# Patient Record
Sex: Female | Born: 1960 | Race: White | Hispanic: No | Smoking: Current every day smoker
Health system: Southern US, Community
[De-identification: ages and names within clinical notes are randomized; demographics above are authoritative.]

## PROBLEM LIST (undated history)

## (undated) DIAGNOSIS — M7918 Myalgia, other site: Secondary | ICD-10-CM

## (undated) DIAGNOSIS — N816 Rectocele: Secondary | ICD-10-CM

## (undated) DIAGNOSIS — G8929 Other chronic pain: Secondary | ICD-10-CM

## (undated) DIAGNOSIS — K5909 Other constipation: Secondary | ICD-10-CM

## (undated) DIAGNOSIS — D649 Anemia, unspecified: Secondary | ICD-10-CM

## (undated) DIAGNOSIS — Z5189 Encounter for other specified aftercare: Secondary | ICD-10-CM

## (undated) DIAGNOSIS — R1011 Right upper quadrant pain: Secondary | ICD-10-CM

## (undated) DIAGNOSIS — K589 Irritable bowel syndrome without diarrhea: Secondary | ICD-10-CM

## (undated) DIAGNOSIS — M810 Age-related osteoporosis without current pathological fracture: Secondary | ICD-10-CM

## (undated) DIAGNOSIS — G43909 Migraine, unspecified, not intractable, without status migrainosus: Secondary | ICD-10-CM

## (undated) DIAGNOSIS — G4700 Insomnia, unspecified: Secondary | ICD-10-CM

## (undated) DIAGNOSIS — IMO0002 Reserved for concepts with insufficient information to code with codable children: Secondary | ICD-10-CM

## (undated) HISTORY — DX: Age-related osteoporosis without current pathological fracture: M81.0

## (undated) HISTORY — DX: Reserved for concepts with insufficient information to code with codable children: IMO0002

## (undated) HISTORY — DX: Other chronic pain: G89.29

## (undated) HISTORY — PX: ESOPHAGOGASTRODUODENOSCOPY: SHX1529

## (undated) HISTORY — DX: Rectocele: N81.6

## (undated) HISTORY — PX: OTHER SURGICAL HISTORY: SHX169

## (undated) HISTORY — DX: Irritable bowel syndrome, unspecified: K58.9

## (undated) HISTORY — DX: Migraine, unspecified, not intractable, without status migrainosus: G43.909

## (undated) HISTORY — PX: TOTAL ABDOMINAL HYSTERECTOMY W/ BILATERAL SALPINGOOPHORECTOMY: SHX83

## (undated) HISTORY — DX: Right upper quadrant pain: R10.11

## (undated) HISTORY — DX: Anemia, unspecified: D64.9

## (undated) HISTORY — PX: TONSILLECTOMY: SUR1361

## (undated) HISTORY — DX: Encounter for other specified aftercare: Z51.89

## (undated) HISTORY — PX: COLONOSCOPY: SHX174

## (undated) HISTORY — DX: Insomnia, unspecified: G47.00

## (undated) HISTORY — DX: Other constipation: K59.09

## (undated) HISTORY — DX: Myalgia, other site: M79.18

## (undated) HISTORY — PX: APPENDECTOMY: SHX54

---

## 1998-06-07 ENCOUNTER — Emergency Department (HOSPITAL_COMMUNITY): Admission: EM | Admit: 1998-06-07 | Discharge: 1998-06-07 | Payer: Self-pay | Admitting: Emergency Medicine

## 1999-11-04 ENCOUNTER — Encounter: Payer: Self-pay | Admitting: Gastroenterology

## 1999-11-04 ENCOUNTER — Encounter: Admission: RE | Admit: 1999-11-04 | Discharge: 1999-11-04 | Payer: Self-pay | Admitting: Gastroenterology

## 1999-11-07 ENCOUNTER — Encounter: Payer: Self-pay | Admitting: General Surgery

## 1999-11-07 ENCOUNTER — Encounter: Admission: RE | Admit: 1999-11-07 | Discharge: 1999-11-07 | Payer: Self-pay | Admitting: General Surgery

## 2000-01-23 ENCOUNTER — Observation Stay (HOSPITAL_COMMUNITY): Admission: RE | Admit: 2000-01-23 | Discharge: 2000-01-23 | Payer: Self-pay | Admitting: General Surgery

## 2000-01-23 ENCOUNTER — Encounter (INDEPENDENT_AMBULATORY_CARE_PROVIDER_SITE_OTHER): Payer: Self-pay | Admitting: Specialist

## 2000-02-04 HISTORY — PX: CHOLECYSTECTOMY: SHX55

## 2001-02-12 ENCOUNTER — Ambulatory Visit (HOSPITAL_COMMUNITY): Admission: RE | Admit: 2001-02-12 | Discharge: 2001-02-12 | Payer: Self-pay | Admitting: Family Medicine

## 2001-02-12 ENCOUNTER — Encounter: Payer: Self-pay | Admitting: Family Medicine

## 2002-07-26 ENCOUNTER — Other Ambulatory Visit: Admission: RE | Admit: 2002-07-26 | Discharge: 2002-07-26 | Payer: Self-pay | Admitting: Family Medicine

## 2005-07-15 ENCOUNTER — Emergency Department (HOSPITAL_COMMUNITY): Admission: EM | Admit: 2005-07-15 | Discharge: 2005-07-15 | Payer: Self-pay | Admitting: Family Medicine

## 2006-02-05 ENCOUNTER — Ambulatory Visit: Payer: Self-pay | Admitting: Internal Medicine

## 2006-02-13 ENCOUNTER — Ambulatory Visit: Payer: Self-pay | Admitting: Internal Medicine

## 2006-02-13 LAB — CONVERTED CEMR LAB
Calcium: 8.7 mg/dL (ref 8.4–10.5)
Creatinine, Ser: 0.7 mg/dL (ref 0.4–1.2)
Glomerular Filtration Rate, Af Am: 117 mL/min/{1.73_m2}
Potassium: 3.7 meq/L (ref 3.5–5.1)
TSH: 2.01 microintl units/mL (ref 0.35–5.50)

## 2006-03-30 ENCOUNTER — Ambulatory Visit: Payer: Self-pay | Admitting: Internal Medicine

## 2006-04-02 ENCOUNTER — Encounter: Admission: RE | Admit: 2006-04-02 | Discharge: 2006-04-02 | Payer: Self-pay | Admitting: Internal Medicine

## 2006-06-04 ENCOUNTER — Ambulatory Visit: Payer: Self-pay | Admitting: Internal Medicine

## 2006-06-09 ENCOUNTER — Encounter: Payer: Self-pay | Admitting: Internal Medicine

## 2006-06-09 ENCOUNTER — Ambulatory Visit (HOSPITAL_COMMUNITY): Admission: RE | Admit: 2006-06-09 | Discharge: 2006-06-09 | Payer: Self-pay | Admitting: Internal Medicine

## 2007-05-05 ENCOUNTER — Ambulatory Visit: Payer: Self-pay | Admitting: Internal Medicine

## 2007-05-05 LAB — CONVERTED CEMR LAB
Alkaline Phosphatase: 49 units/L (ref 39–117)
Amylase: 40 units/L (ref 27–131)
Basophils Absolute: 0.1 10*3/uL (ref 0.0–0.1)
Bilirubin, Direct: 0.1 mg/dL (ref 0.0–0.3)
Eosinophils Absolute: 0.1 10*3/uL (ref 0.0–0.7)
MCHC: 32.5 g/dL (ref 30.0–36.0)
Neutro Abs: 3.7 10*3/uL (ref 1.4–7.7)
Neutrophils Relative %: 50.8 % (ref 43.0–77.0)
Platelets: 210 10*3/uL (ref 150–400)
RDW: 12.5 % (ref 11.5–14.6)

## 2007-07-12 ENCOUNTER — Telehealth: Payer: Self-pay | Admitting: Internal Medicine

## 2007-07-20 ENCOUNTER — Emergency Department (HOSPITAL_COMMUNITY): Admission: EM | Admit: 2007-07-20 | Discharge: 2007-07-20 | Payer: Self-pay | Admitting: Family Medicine

## 2007-10-01 ENCOUNTER — Telehealth: Payer: Self-pay | Admitting: Internal Medicine

## 2007-10-06 ENCOUNTER — Encounter: Payer: Self-pay | Admitting: Internal Medicine

## 2007-10-07 ENCOUNTER — Encounter: Admission: RE | Admit: 2007-10-07 | Discharge: 2007-10-07 | Payer: Self-pay | Admitting: Internal Medicine

## 2007-10-07 ENCOUNTER — Encounter: Payer: Self-pay | Admitting: Internal Medicine

## 2008-03-23 ENCOUNTER — Ambulatory Visit: Payer: Self-pay | Admitting: Internal Medicine

## 2008-03-23 DIAGNOSIS — K5900 Constipation, unspecified: Secondary | ICD-10-CM | POA: Insufficient documentation

## 2008-03-23 DIAGNOSIS — K589 Irritable bowel syndrome without diarrhea: Secondary | ICD-10-CM | POA: Insufficient documentation

## 2008-03-23 DIAGNOSIS — R1011 Right upper quadrant pain: Secondary | ICD-10-CM

## 2008-03-23 LAB — CONVERTED CEMR LAB: IgA: 136 mg/dL (ref 68–378)

## 2009-01-19 ENCOUNTER — Ambulatory Visit: Payer: Self-pay | Admitting: Internal Medicine

## 2009-01-19 DIAGNOSIS — R1012 Left upper quadrant pain: Secondary | ICD-10-CM

## 2009-07-31 ENCOUNTER — Telehealth: Payer: Self-pay | Admitting: Internal Medicine

## 2009-07-31 ENCOUNTER — Ambulatory Visit: Payer: Self-pay | Admitting: Gastroenterology

## 2009-07-31 DIAGNOSIS — R110 Nausea: Secondary | ICD-10-CM

## 2009-08-01 ENCOUNTER — Ambulatory Visit (HOSPITAL_COMMUNITY): Admission: RE | Admit: 2009-08-01 | Discharge: 2009-08-01 | Payer: Self-pay | Admitting: Gastroenterology

## 2009-08-01 LAB — CONVERTED CEMR LAB
ALT: 15 units/L (ref 0–35)
Alkaline Phosphatase: 52 units/L (ref 39–117)
Basophils Relative: 0.5 % (ref 0.0–3.0)
Eosinophils Relative: 1.6 % (ref 0.0–5.0)
Monocytes Relative: 4.8 % (ref 3.0–12.0)
Neutrophils Relative %: 48.6 % (ref 43.0–77.0)
Platelets: 172 10*3/uL (ref 150.0–400.0)
RBC: 4.25 M/uL (ref 3.87–5.11)
RDW: 13.9 % (ref 11.5–14.6)
Total Protein: 6.9 g/dL (ref 6.0–8.3)

## 2009-08-20 ENCOUNTER — Ambulatory Visit: Payer: Self-pay | Admitting: Internal Medicine

## 2009-08-20 ENCOUNTER — Encounter (INDEPENDENT_AMBULATORY_CARE_PROVIDER_SITE_OTHER): Payer: Self-pay | Admitting: *Deleted

## 2009-08-20 DIAGNOSIS — R1319 Other dysphagia: Secondary | ICD-10-CM

## 2009-08-20 DIAGNOSIS — R109 Unspecified abdominal pain: Secondary | ICD-10-CM

## 2009-09-17 ENCOUNTER — Encounter (INDEPENDENT_AMBULATORY_CARE_PROVIDER_SITE_OTHER): Payer: Self-pay | Admitting: *Deleted

## 2009-12-05 ENCOUNTER — Telehealth: Payer: Self-pay | Admitting: Internal Medicine

## 2009-12-06 ENCOUNTER — Telehealth: Payer: Self-pay | Admitting: Internal Medicine

## 2009-12-06 ENCOUNTER — Encounter: Payer: Self-pay | Admitting: Internal Medicine

## 2009-12-10 ENCOUNTER — Ambulatory Visit: Payer: Self-pay | Admitting: Internal Medicine

## 2009-12-10 ENCOUNTER — Telehealth: Payer: Self-pay | Admitting: Internal Medicine

## 2009-12-10 ENCOUNTER — Encounter: Payer: Self-pay | Admitting: Internal Medicine

## 2009-12-10 DIAGNOSIS — R112 Nausea with vomiting, unspecified: Secondary | ICD-10-CM

## 2010-01-10 ENCOUNTER — Ambulatory Visit: Payer: Self-pay | Admitting: Internal Medicine

## 2010-01-22 ENCOUNTER — Telehealth: Payer: Self-pay | Admitting: Internal Medicine

## 2010-03-07 NOTE — Progress Notes (Signed)
Summary: Nausea & vomitting   Phone Note Call from Patient   Call For: Dr Leone Payor Summary of Call: Having nausea, vomitting & abd pain since 6am today. Initial call taken by: Leanor Kail [facility],  December 10, 2009 8:27 AM  Follow-up for Phone Call        Patient wants to be seen today by Dr Leone Payor or the extender for nausea, vomiting, and abdominal pain.  She is scheduled to see Willette Cluster RNP today at 2:00 Follow-up by: Darcey Nora RN, CGRN,  December 10, 2009 9:02 AM

## 2010-03-07 NOTE — Progress Notes (Signed)
Summary: Triage   Phone Note Call from Patient Call back at Work Phone [phone]   Caller: Patient Call For: Dr. Leone Payor Reason for Call: Talk to Nurse Summary of Call: "2nd attack of the day"...pain in upper right side Initial call taken by: Karna Christmas,  December 05, 2009 12:54 PM  Follow-up for Phone Call        Patient  had an "attack of pain on my right upper side" this am at 5:30 she states this pain lasted about 1 1/2 hours then stopped.  Just before she called pain started again.  She took some levsin and this has relieved her pain.  She reports she is better right now.  She is advised to continue the Levsin as needed and call us back if symptoms not relieved with Levsin.  She states " I am feeling much better".  She wanted to know how may Levsin she can take when she has an attack .  She is advsied she may take 2 every 4-6 hours as needed.  She will call back for further problems. Follow-up by: Darcey Nora RN, CGRN,  December 05, 2009 1:48 PM  Additional Follow-up for Phone Call Additional follow up Details #1::        Correct advice was given Additional Follow-up by: Iva Boop MD, [facility],  December 05, 2009 2:10 PM

## 2010-03-07 NOTE — Progress Notes (Signed)
Summary: Jury Duty   Phone Note Call from Patient Call back at Work Phone [phone]   Caller: Patient Call For: Dr. Leone Payor Summary of Call: Sch'd for Jury Duty for 12-12-11and requesting to be excused for IBS.  Initial call taken by: Karna Christmas,  December 06, 2009 8:06 AM  Follow-up for Phone Call        Dr Leone Payor is this appropriate? Darcey Nora RN, [facility]  December 06, 2009 8:09 AM  Additional Follow-up for Phone Call Additional follow up Details #1::        I will do it - reasonable if she were to get an attack there would be problem. Additional Follow-up by: Iva Boop MD, [facility],  December 06, 2009 8:31 AM     Appended Document: Payton Mccallum Duty patient aware to come pick up the Jury duty note

## 2010-03-07 NOTE — Assessment & Plan Note (Signed)
Summary: RUQ abdominal pain/sheri   History of Present Illness Visit Type: Follow-up Visit Primary GI MD: Stan Head MD [facility] Primary Provider: Elfredia Nevins, MD Requesting Provider: n/a Chief Complaint: RUQ abdominal pain that is constant.  Woke pt up at 4am today.  Radiated to chest and back.  Pt is still having a nagging pain. History of Present Illness:   PLEASANT 50 YO FEMALE KNOWN TO DR. Leone Payor. SHE IS S/P CHOLECYSTECTOMY 2002, HAS HX OF IBS. SHE HAS BEEN SEEN IN THE PAST AFTER EPISODIC RUQ PAIN, ASSOCIATED WITH N/V-LAST SEEN 2/10. SHE COMES IN TODAY WITH ACUTE SEVERE EPISODE OF RUQ PAIN RADIATING TO HER BACK WHICH WOKE HER UP FROM SLEEP AT 4 AM TODAY. PAIN WAS INTENSE SQUEEZING, DOUBLED HER OVER AND LASTED ABOUT 45 MINUTES THEN EASED. SHE WAS NAUSEATED BUT DID NOT VOMIT, NO DIAPHORESIS, SOB ETC. SHE CALLED HER FATHER  AS SHE THOUGHT SHE WOULD NEED TO GO TO THE ER-BUT FINALLY DECREASED. SHE HAS PERSITENT PAIN NOW, ALL DAY 2-3/10, NO APPETITE, ONE EPISODE OF DIARRHEA THIS AM. IN BACKGROUND OF THIS SAYS HAS HAD SXS THIS PAST YEAR WITH ALMOST CONSTANT LOW GRADE ACHNG IN HER RUQ AND IN TO RIGHT BACK. THE INTENSE PAIN REMINDS HER OF HER GB SXS.    GI Review of Systems    Reports abdominal pain, bloating, loss of appetite, and  nausea.     Location of  Abdominal pain: RUQ.    Denies acid reflux, belching, chest pain, dysphagia with liquids, dysphagia with solids, heartburn, vomiting, vomiting blood, and  weight loss.      Reports diarrhea.     Denies anal fissure, black tarry stools, change in bowel habit, constipation, diverticulosis, fecal incontinence, hemorrhoids, irritable bowel syndrome, jaundice, light color stool, liver problems, rectal bleeding, and  rectal pain.   Current Medications (verified): 1)  Tylenol With Codeine #3 300-30 Mg Tabs (Acetaminophen-Codeine) .... 1/2 Tablet By Mouth Once Daily As Needed For Pain  Allergies (verified): 1)  ! Penicillin  Past  History:  Past Medical History: Reviewed history from 05/05/2007 and no changes required. IBS - mixed Insomnia Rectocele/Cystocele  Past Surgical History: Appendectomy Cholecystectomy 2002 TAH and BSO Adhesiolysis Tonsillectomy  Family History: Reviewed history from 03/23/2008 and no changes required. Patient adopted   Social History: Married, 1 girl Occupation:[employer]  Patient currently smokes. -1ppd Alcohol Use - no Daily Caffeine Use-5 Illicit Drug Use - no Patient does not get regular exercise.  Patient has been counseled to quit. - Refuses  Review of Systems       The patient complains of back pain.  The patient denies allergy/sinus, anemia, anxiety-new, arthritis/joint pain, blood in urine, breast changes/lumps, change in vision, confusion, cough, coughing up blood, depression-new, fainting, fatigue, fever, headaches-new, hearing problems, heart murmur, heart rhythm changes, itching, menstrual pain, muscle pains/cramps, night sweats, nosebleeds, pregnancy symptoms, shortness of breath, skin rash, sleeping problems, sore throat, swelling of feet/legs, swollen lymph glands, thirst - excessive , urination - excessive , urination changes/pain, urine leakage, vision changes, and voice change.         SEE HPI  Vital Signs:  Patient profile:   50 year old female Height:      64 inches Weight:      135 pounds BMI:     23.26 BSA:     1.66 Pulse rate:   64 / minute Pulse rhythm:   regular BP sitting:   120 / 82  (left arm) Cuff size:   regular  Vitals  Entered By: Francee Piccolo CMA Duncan Dull) (July 31, 2009 2:39 PM)  Physical Exam  General:  Well developed, well nourished, no acute distress. Head:  Normocephalic and atraumatic. Eyes:  PERRLA, no icterus. Lungs:  Clear throughout to auscultation. Heart:  Regular rate and rhythm; no murmurs, rubs,  or bruits. Abdomen:  SOFT, TENDER RUQ,EPIGASTRIUM, NO GUARDING OR REBOUND, NO MASS OR HSM,BS+ Rectal:  NOT  DONE Extremities:  No clubbing, cyanosis, edema or deformities noted. Neurologic:  Alert and  oriented x4;  grossly normal neurologically. Psych:  Alert and cooperative. Normal mood and affect.  Impression & Recommendations:  Problem # 1:  RUQ PAIN (ICD-789.01) Assessment Deteriorated 50 YO FEMALE WITH ONE YEAR HX OF CHRONIC INTERMITTENT MILD RUQ PAIN, NOW WITH ACUTE SEVERE RUQ PAIN ONSET TODAY. R/O CBD STONE, R/O SPHINCTER OF ODDI DYSFUNCTION. LABS AS BELOW SCHEDULE FOR UPPER ABDOMINAL US WITHIN NEXT 24 HOURS SOFT, BLAND DIET EMPIRIC ACIPHEX 20 MG DAILY IN AM-SAMPLES GIVEN PT HAS TYLENOL #3 AT HOME FOR as needed USE. FURTHER WORKUP WITH DR. Leone Payor PENDING ABOVE. Orders: TLB-CBC Platelet - w/Differential (85025-CBCD) TLB-Hepatic/Liver Function Pnl (80076-HEPATIC) TLB-Lipase (83690-LIPASE) Ultrasound Abdomen (UAS)  Patient Instructions: 1)  You have been scheduled for an abdominal ultrasound 08/01/09 @ 8 am. Please arrive to [facility] at 7:45 am for registration. 2)  Please go to the lab before leaving the office today to have your CBC, Hepatic panel and Lipase drawn. 3)  Take Aciphex 1 tablet by mouth every morning. Samples have been provided. 4)  Follow a soft, bland diet. 5)  Take your Tylenol #3 as needed. 6)  Copy sent to : Dr Elfredia Nevins 7)  The medication list was reviewed and reconciled.  All changed / newly prescribed medications were explained.  A complete medication list was provided to the patient / caregiver.

## 2010-03-07 NOTE — Letter (Signed)
Summary: EGD Instructions  [facility]  [address] [place], [place] [address]   Phone: [phone]  Fax: [phone]       ALIESHA DOLATA    1960/10/29    MRN: [id number]       Procedure Day /Date:01-10-10     Arrival Time:1:00 PM      Procedure Time: 2:00 PM     Location of Procedure:                    X    [facility] (4th Floor)  PREPARATION FOR ENDOSCOPY   On 01-10-10  THE DAY OF THE PROCEDURE:  1.   No solid foods, milk or milk products are allowed after midnight the night before your procedure.  2.   Do not drink anything colored red or purple.  Avoid juices with pulp.  No orange juice.  3.  You may drink clear liquids until Noon , which is 2 hours before your procedure.                                                                                                CLEAR LIQUIDS INCLUDE: Water Jello Ice Popsicles Tea (sugar ok, no milk/cream) Powdered fruit flavored drinks Coffee (sugar ok, no milk/cream) Gatorade Juice: apple, white grape, white cranberry  Lemonade Clear bullion, consomm, broth Carbonated beverages (any kind) Strained chicken noodle soup Hard Candy   MEDICATION INSTRUCTIONS  Unless otherwise instructed, you should take regular prescription medications with a small sip of water as early as possible the morning of your procedure.           OTHER INSTRUCTIONS  You will need a responsible adult at least 48 years of age to accompany you and drive you home.   This person must remain in the waiting room during your procedure.  Wear loose fitting clothing that is easily removed.  Leave jewelry and other valuables at home.  However, you may wish to bring a book to read or an iPod/MP3 player to listen to music as you wait for your procedure to start.  Remove all body piercing jewelry and leave at home.  Total time from sign-in until discharge is approximately 2-3 hours.  You should go home directly after your  procedure and rest.  You can resume normal activities the day after your procedure.  The day of your procedure you should not:   Drive   Make legal decisions   Operate machinery   Drink alcohol   Return to work  You will receive specific instructions about eating, activities and medications before you leave.    The above instructions have been reviewed and explained to me by   _______________________    I fully understand and can verbalize these instructions _____________________________ Date _________

## 2010-03-07 NOTE — Letter (Signed)
Summary: [name] and No Reschedule  [facility]  [address] [place], [place] [address]   Phone: [phone]  Fax: [phone]      September 17, 2009 MRN: [id number]   [name] [address] [place], [place]  [address]     You recently cancelled your endoscopic procedure at the [facility] and did not reschedule for another date.    Your Harbert Fitterer recommended this procedure for the benefit of your health.  It is very important that you reschedule it.  Failure to do so may be to the detriment of your health.  Please call us at [phone] and we will be happy to assist you with rescheduling.    If you were referred for this procedure by another physician/[name], we will notify him/her that you did not keep your appointment.   Sincerely,  [facility]

## 2010-03-07 NOTE — Procedures (Signed)
Summary: Upper Endoscopy  Patient: [name] Note: All result statuses are Final unless otherwise noted.  Tests: (1) Upper Endoscopy (EGD)   EGD Upper Endoscopy       DONE     [facility]     [address]     [place], [place]  [address]           ENDOSCOPY PROCEDURE REPORT           PATIENT:  [name], [name]  MR#:  [id number]     BIRTHDATE:  April 04, 1960, 49 yrs. old  GENDER:  female           ENDOSCOPIST:  Iva Boop, MD, [facility]           PROCEDURE DATE:  01/10/2010     PROCEDURE:  EGD, diagnostic 08657     ASA CLASS:  Class I     INDICATIONS:  abdominal pain, right upper quad           MEDICATIONS:   Fentanyl 50 mcg IV, Versed 5 mg     TOPICAL ANESTHETIC:  Exactacain Spray           DESCRIPTION OF PROCEDURE:   After the risks benefits and     alternatives of the procedure were thoroughly explained, informed     consent was obtained.  The LB [id number] endoscope was     introduced through the mouth and advanced to the second portion of     the duodenum, without limitations.  The instrument was slowly     withdrawn as the mucosa was fully examined.     <<PROCEDUREIMAGES>>           The upper, middle, and distal third of the esophagus were     carefully inspected and no abnormalities were noted. The z-line     was well seen at the GEJ. The endoscope was pushed into the fundus     which was normal including a retroflexed view. The antrum,gastric     body, first and second part of the duodenum were unremarkable.     Z-line at 39 cm.    Retroflexed views revealed no abnormalities.     The scope was then withdrawn from the patient and the procedure     completed.           COMPLICATIONS:  None           ENDOSCOPIC IMPRESSION:     1) Normal EGD     RECOMMENDATIONS:     1) start hyoscyamine 0.375 milligrams twice a day for abdominal     pain     2) may still take the hyoscyamine (levsin) 0.125 mg under the     tongue as needed if have attacks of RUQ pain    3) Follow-up with Dr. Leone Payor if this is not successful     treatment for pain and Irritable Bowel Syndrome           REPEAT EXAM:  In for as needed.           Iva Boop, MD, [facility]           CC:  Elfredia Nevins, MD     The Patient           n.     eSIGNED:   Iva Boop at 01/10/2010 02:38 PM           [place], [place], [id number]  Note: An exclamation mark (!) indicates  a result that was not dispersed into the flowsheet. Document Creation Date: 01/10/2010 2:38 PM _______________________________________________________________________  (1) Order result status: Final Collection or observation date-time: 01/10/2010 14:27 Requested date-time:  Receipt date-time:  Reported date-time:  Referring Physician:   Ordering Physician: Stan Head [id number]) Specimen Source:  Source: [name] Order Number: [id number] Lab site:

## 2010-03-07 NOTE — Progress Notes (Signed)
Summary: Triage  Medications Added DICYCLOMINE HCL 20 MG TABS (DICYCLOMINE HCL) 1 by mouth ac and hs       Phone Note Call from Patient Call back at Work Phone [phone]   Caller: Patient Call For: Dr. Leone Payor Reason for Call: Talk to Nurse Summary of Call: Last time she was in she was told she could be referred to someone at [facility]. Would like appt. sch'd Initial call taken by: Karna Christmas,  January 22, 2010 12:07 PM  Follow-up for Phone Call        Patient  was referred [facility] in 03/2008 for constipation.  Dr Leone Payor [facility]  Still ok to schedule?  I have left a message for the patient to call back to discuss symptoms. Follow-up by: Darcey Nora RN, CGRN,  January 22, 2010 2:11 PM  Additional Follow-up for Phone Call Additional follow up Details #1::        I spoke with the patient and she is not having constipation, she is having abdominal pain.  Dr Leone Payor she says her pain is daily and nothing is helping.  She is wondering if there is any help at [facility].  Please advise.  She is aware that Dr Leone Payor is out of the office until Monday Additional Follow-up by: Darcey Nora RN, CGRN,  January 22, 2010 4:01 PM    Additional Follow-up for Phone Call Additional follow up Details #2::    1) I now recommend referral to [facility]BS [facility] 2) she can try dicyclomine 20 mg qAC and HS #120 with 1 refill instead of hyoscyamine for abdominal pain  Follow-up by: Iva Boop MD, [facility],  January 26, 2010 7:19 AM  Additional Follow-up for Phone Call Additional follow up Details #3:: Details for Additional Follow-up Action Taken: [facility] for patient to return call per Dr Marvell Fuller recommendations.Graciella Freer, RN 01/29/10 @ 1105am   New rx sent to patient's pharmacy.  Records sent to [facility] for referral. Additional Follow-up by: Darcey Nora RN, CGRN,  January 30, 2010 11:41 AM  New/Updated Medications: DICYCLOMINE HCL 20 MG  TABS (DICYCLOMINE HCL) 1 by mouth ac and hs Prescriptions: DICYCLOMINE HCL 20 MG TABS (DICYCLOMINE HCL) 1 by mouth ac and hs  #120 x 1   Entered by:   Darcey Nora RN, CGRN   Authorized by:   Iva Boop MD, [facility]   Signed by:   Darcey Nora RN, CGRN on 01/30/2010   Method used:   Electronically to        [facility]  [address] [phone]* (retail)       [address]       [place], [place]  [address]       Ph: [phone]       Fax: [phone]   RxID:   [id number]

## 2010-03-07 NOTE — Miscellaneous (Signed)
Summary: hyoscyamine 0.375 mg rx  Clinical Lists Changes  Medications: Added new medication of HYOSCYAMINE SULFATE CR 0.375 MG  TB12 (HYOSCYAMINE SULFATE) 1 by mouth two times a day regularly to prevent abdominal pain - Signed Changed medication from LEVSIN/SL 0.125 MG SUBL (HYOSCYAMINE SULFATE) Dissolve 1 tablet under the tongue every 4-6 hours as needed from abdominal discomfort to HYOSCYAMINE SULFATE 0.125 MG SUBL (HYOSCYAMINE SULFATE) Rx of HYOSCYAMINE SULFATE CR 0.375 MG  TB12 (HYOSCYAMINE SULFATE) 1 by mouth two times a day regularly to prevent abdominal pain;  #60 x 5;  Signed;  Entered by: Iva Boop MD, [facility];  Authorized by: Iva Boop MD, [facility];  Method used: [facility] [address] [phone]*, [address] [place], [place], [place]  [address], Ph: [phone], Fax: [phone]    Prescriptions: HYOSCYAMINE SULFATE CR 0.375 MG  TB12 (HYOSCYAMINE SULFATE) 1 by mouth two times a day regularly to prevent abdominal pain  #60 x 5   Entered and Authorized by:   Iva Boop MD, [facility]   Signed by:   Iva Boop MD, FACG on 01/10/2010   Method used:   [facility]        [facility]  [address] [id number])       [address]       [place], [place]  [address]       Ph: [phone]       Fax: [phone]   RxID:   [id number]

## 2010-03-07 NOTE — Letter (Signed)
Summary: Generic Letter: Out of [facility]  [address] [place], [place] [address]   Phone: [phone]  Fax: [phone]    12/06/2009  To Whom it May Concern:  Re: [name] [address] [place], [place]  [address]  Ladies and Gentlemen:  [name] is under my care for a chronic medical condition with episodic and unpredictable flares of symptoms that make her unable to work or concentrate in a manner that would be necessary to serve as a juror, in my opinion.  I recommend that she be excused from jury duty because of this medical condition.   Yours Truly,   Stan Head MD, [facility]

## 2010-03-07 NOTE — Progress Notes (Signed)
Summary: Abd Pain   Phone Note Call from Patient Call back at Home Phone [phone]   Call For: Dr Leone Payor Reason for Call: Talk to Nurse Summary of Call: Intense abd Pain pain on by her pancreas. Want to be worked in sooner than 08-20-09. Initial call taken by: Leanor Kail [facility],  July 31, 2009 8:13 AM  Follow-up for Phone Call        Left message for patient to call back Darcey Nora RN, [facility]  July 31, 2009 8:47 AM Follow-up by: Darcey Nora RN, CGRN,  July 31, 2009 8:47 AM  Additional Follow-up for Phone Call Additional follow up Details #1::        Patient  had an "attack" of RUQ abdominal pain that started at 4 am and lasted about 45 minutes.  She described the pain as "severe".  She has a lasting "dull ache".  Patient  wants to be seen earlier than 07/21/09, she will come in today at 2:30 to see Mike Gip PA  Additional Follow-up by: Darcey Nora RN, CGRN,  July 31, 2009 8:57 AM

## 2010-03-07 NOTE — Assessment & Plan Note (Signed)
Summary: nausea, vomiting, and abdominal pain/sheri    History of Present Illness Visit Type: Follow-up Visit Primary GI MD: Stan Head MD [facility] Primary Provider: Elfredia Nevins, MD Requesting Provider: n/a Chief Complaint: nausea vomiting and abd pain since 6 am this morning History of Present Illness:   Patient followed by Dr. Leone Payor for upper abdominal pain and IBS. She is here with two separate problems. First, she has intermittent episodes of RUQ pain,  Attacks occur once a month on average and always occur between 2-5am when lying on right side.  Seen in July for upper abdominal pain. Given trial of Levsin and scheduled for EGD but patient cancelled secondary to finances. She recently took Levsin for the first time and it did help the RUQ pain.  Second problem started several weeks ago. Patient has epigastric pain associated with nausea, heaving, and constant belching of air. Describes pain as pressure, unrelated to meals.  This am she woke up nauseated with dry heaves. Went back to sleep, woke up  with violent vomiting. Emesis contained food eaten 24 hours prior.  No GERD symptoms.    GI Review of Systems    Reports abdominal pain, nausea, and  vomiting.     Location of  Abdominal pain: generalized.    Denies acid reflux, belching, bloating, chest pain, dysphagia with liquids, dysphagia with solids, heartburn, loss of appetite, vomiting blood, weight loss, and  weight gain.        Denies anal fissure, black tarry stools, change in bowel habit, constipation, diarrhea, diverticulosis, fecal incontinence, heme positive stool, hemorrhoids, irritable bowel syndrome, jaundice, light color stool, liver problems, rectal bleeding, and  rectal pain.    Current Medications (verified): 1)  Tylenol With Codeine #3 300-30 Mg Tabs (Acetaminophen-Codeine) .... 1/2 Tablet By Mouth Once Daily As Needed For Pain 2)  Levsin/sl 0.125 Mg Subl (Hyoscyamine Sulfate) .... Dissolve 1 Tablet Under The  Tongue Every 4-6 Hours As Needed From Abdominal Discomfort  Allergies (verified): 1)  ! Penicillin  Past History:  Past Medical History: Reviewed history from 05/05/2007 and no changes required. IBS - mixed Insomnia Rectocele/Cystocele  Past Surgical History: Reviewed history from 07/31/2009 and no changes required. Appendectomy Cholecystectomy 2002 TAH and BSO Adhesiolysis Tonsillectomy  Family History: Reviewed history from 03/23/2008 and no changes required. Patient adopted   Social History: Reviewed history from 07/31/2009 and no changes required. Married, 1 girl Occupation:[employer]ing  Patient currently smokes. -1ppd Alcohol Use - no Daily Caffeine Use-5 Illicit Drug Use - no Patient does not get regular exercise.  Patient has been counseled to quit. - Refuses  Review of Systems  The patient denies allergy/sinus, anemia, anxiety-new, arthritis/joint pain, back pain, blood in urine, breast changes/lumps, change in vision, confusion, cough, coughing up blood, depression-new, fainting, fatigue, fever, headaches-new, hearing problems, heart murmur, heart rhythm changes, itching, menstrual pain, muscle pains/cramps, night sweats, nosebleeds, pregnancy symptoms, shortness of breath, skin rash, sleeping problems, sore throat, swelling of feet/legs, swollen lymph glands, thirst - excessive , urination - excessive , urination changes/pain, urine leakage, vision changes, and voice change.    Vital Signs:  Patient profile:   50 year old female Height:      64 inches Weight:      139 pounds BMI:     23.95 Pulse rate:   78 / minute Pulse rhythm:   regular BP sitting:   120 / 70  (left arm)  Vitals Entered By: Chales Abrahams CMA Duncan Dull) (December 10, 2009 2:07 PM)  Physical Exam  General:  Well developed, well nourished, no acute distress. Head:  Normocephalic and atraumatic. Eyes:  Conjunctiva pink, no icterus.  Neck:  no obvious masses  Lungs:  Clear throughout  to auscultation. Heart:  Regular rate and rhythm; no murmurs, rubs,  or bruits. Abdomen:  Abdomen soft, nontender, nondistended. No obvious masses or hepatomegaly.Normal bowel sounds.  Msk:  Symmetrical with no gross deformities. Normal posture. Extremities:  No palmar erythema, no edema.  Neurologic:  Alert and  oriented x4;  grossly normal neurologically. Skin:  Intact without significant lesions or rashes. Cervical Nodes:  No significant cervical adenopathy. Psych:  Alert and cooperative. Normal mood and affect.   Impression & Recommendations:  Problem # 1:  ABDOMINAL PAIN, UPPER (ICD-789.09) Assessment Deteriorated Has been followed by Dr. Leone Payor for RUQ pain. Now, in addition to that, patient has epigastric pain, nausea, vomiting and excessive belching of air. She does not have a gallbladder. Labs and U/S in June 2011 were unrevealing. Problems may be functional in nature but upper endoscopy should be done for further evaluation.  Orders: EGD (EGD)  Problem # 2:  IRRITABLE BOWEL SYNDROME (ICD-564.1) Assessment: Unchanged No major issues with bowel movements at present.  Patient Instructions: 1)  We have scheduled the Endoscopy with  Dr. Leone Payor. 2)  Directions and brochure provided. 3)  [facility] Patient Information Guide given to patient. 4)  Copy sent to : Elfredia Nevins, MD 5)  The medication list was reviewed and reconciled.  All changed / newly prescribed medications were explained.  A complete medication list was provided to the patient / caregiver.

## 2010-03-07 NOTE — Letter (Signed)
Summary: EGD Instructions  [facility]  [address] [place], [place] [address]   Phone: [phone]  Fax: [phone]       [name]    Nov 10, 1960    MRN: [id number]       Procedure Day Dorna Bloom: [name], 09/25/09     Arrival Time: 3:00 PM     Procedure Time: 4:00 PM     Location of Procedure:                    _X_ [facility] (4th Floor)  PREPARATION FOR ENDOSCOPY   On TUESDAY, 09/25/09,  THE DAY OF THE PROCEDURE:  1.   No solid foods, milk or milk products are allowed after midnight the night before your procedure.  2.   Do not drink anything colored red or purple.  Avoid juices with pulp.  No orange juice.  3.  You may drink clear liquids until 2:00 PM, which is 2 hours before your procedure.                                                                                                CLEAR LIQUIDS INCLUDE: Water Jello Ice Popsicles Tea (sugar ok, no milk/cream) Powdered fruit flavored drinks Coffee (sugar ok, no milk/cream) Gatorade Juice: apple, white grape, white cranberry  Lemonade Clear bullion, consomm, broth Carbonated beverages (any kind) Strained chicken noodle soup Hard Candy   MEDICATION INSTRUCTIONS  Unless otherwise instructed, you should take regular prescription medications with a small sip of water as early as possible the morning of your procedure.                   OTHER INSTRUCTIONS  You will need a responsible adult at least 48 years of age to accompany you and drive you home.   This person must remain in the waiting room during your procedure.  Wear loose fitting clothing that is easily removed.  Leave jewelry and other valuables at home.  However, you may wish to bring a book to read or an iPod/MP3 player to listen to music as you wait for your procedure to start.  Remove all body piercing jewelry and leave at home.  Total time from sign-in until discharge is approximately 2-3 hours.  You  should go home directly after your procedure and rest.  You can resume normal activities the day after your procedure.  The day of your procedure you should not:   Drive   Make legal decisions   Operate machinery   Drink alcohol   Return to work  You will receive specific instructions about eating, activities and medications before you leave.    The above instructions have been reviewed and explained to me by   _______________________    I fully understand and can verbalize these instructions _____________________________ Date _________

## 2010-03-07 NOTE — Assessment & Plan Note (Signed)
Summary: ABD PAIN...EM    History of Present Illness Visit Type: Follow-up Visit Primary GI MD: Stan Head MD [facility] Primary Provider: Elfredia Nevins, MD Requesting Provider: n/a Chief Complaint: Upper abd pain that is not related to activity or meals. Pt states it is more RUQ abd pain that can radiate across upper abdomen. Pt states she has not taken the Levsin b/c she has not a episode of the original pain.  History of Present Illness:   epigastric and RUQ pain  had seen Amy 1 month ago days of throbbing ache x 2 days, a little worse than NL terribly severe RUQ pain x 45 mins at 0400 one day. The US probe made her sore x 1 day  She is ok today. In last few months every 2 weeks she won't defecate x 2-3 days then she will get multiple bowel movemens.  There is a throbbing pain at times. It is less but does not disappear after she empties bowels.  Used to dissipate or resolve for 2 weeks but now just a few days. Uses 1/2 tylenol #3 as needed, for severe spells, same botle x 1 year Bowel habits are no defecation x 2-3 days then heightened gastrocolic reflex after meals.  3-4 episodes of dysphagia in past few months Stress level unchanged, nothing really bothering her.    GI Review of Systems    Reports abdominal pain.     Location of  Abdominal pain: upper abdomen.    Denies acid reflux, belching, bloating, chest pain, dysphagia with liquids, dysphagia with solids, heartburn, loss of appetite, nausea, vomiting, vomiting blood, weight loss, and  weight gain.        Denies anal fissure, black tarry stools, change in bowel habit, constipation, diarrhea, diverticulosis, fecal incontinence, heme positive stool, hemorrhoids, irritable bowel syndrome, jaundice, light color stool, liver problems, rectal bleeding, and  rectal pain. Preventive Screening-Counseling & Management  Alcohol-Tobacco     Alcohol drinks/day: 0     Smoking Status: current     Smoke Cessation Stage:  precontemplative  Caffeine-Diet-Exercise     Caffeine use/day: 2 cups/coffee     Does Patient Exercise: no     Exercise Counseling: to improve exercise regimen  Comments: walking suggested recommended to quit smoking    Current Medications (verified): 1)  Tylenol With Codeine #3 300-30 Mg Tabs (Acetaminophen-Codeine) .... 1/2 Tablet By Mouth Once Daily As Needed For Pain 2)  Levsin/sl 0.125 Mg Subl (Hyoscyamine Sulfate) .... Dissolve 1 Tablet Under The Tongue Every 4-6 Hours As Needed From Abdominal Discomfort  Allergies (verified): 1)  ! Penicillin  Past History:  Past Medical History: Reviewed history from 05/05/2007 and no changes required. IBS - mixed Insomnia Rectocele/Cystocele  Past Surgical History: Reviewed history from 07/31/2009 and no changes required. Appendectomy Cholecystectomy 2002 TAH and BSO Adhesiolysis Tonsillectomy  Family History: Reviewed history from 03/23/2008 and no changes required. Patient adopted   Social History: Reviewed history from 07/31/2009 and no changes required. Married, 1 girl Occupation:[employer]ing  Patient currently smokes. -1ppd Alcohol Use - no Daily Caffeine Use-5 Illicit Drug Use - no Patient does not get regular exercise.  Patient has been counseled to quit. - Refuses Alcohol drinks/day:  0 Caffeine use/day:  2 cups/coffee  Review of Systems       otherwise per HPI  Vital Signs:  Patient profile:   50 year old female Height:      64 inches Weight:      136.38 pounds BMI:  23.49 Pulse rate:   70 / minute Pulse rhythm:   regular BP sitting:   116 / 72  (left arm) Cuff size:   regular  Vitals Entered By: [name] CMA Duncan Dull) (August 20, 2009 12:04 PM)  Physical Exam  General:  Well developed, well nourished, no acute distress. Chest Wall:  nontender Lungs:  Clear throughout to auscultation. Heart:  Regular rate and rhythm; no murmurs, rubs,  or bruits. Abdomen:  soft and nonthender w/o  HSM not musculoskeletal   Impression & Recommendations:  Problem # 1:  ABDOMINAL PAIN, UPPER (ICD-789.09) Assessment Unchanged Probably functional but could be peptic.inflammatory in nature. She has also had some episodes of dysphagia (? stricture - could need dilation) has never had an EGD Orders: EGD (EGD) - possible esophageal dilation Risks, benefits,and indications of endoscopic procedure(s) were reviewed with the patient and all questions answered.  Problem # 2:  IRRITABLE BOWEL SYNDROME (ICD-564.1) Assessment: Unchanged overall seems stable  Problem # 3:  OTHER DYSPHAGIA (ICD-787.29) Assessment: New rare await EGD, possible dilation reassess sxs prior to EGD  Patient Instructions: 1)  We will see you at your procedure on 09/25/09 2)  [name] Patient Information Guide given to patient.  3)  Upper Endoscopy brochure given.  4)  Copy sent to : Elfredia Nevins, MD 5)  The medication list was reviewed and reconciled.  All changed / newly prescribed medications were explained.  A complete medication list was provided to the patient / caregiver.

## 2010-04-05 ENCOUNTER — Encounter: Payer: Self-pay | Admitting: Internal Medicine

## 2010-05-02 NOTE — Consult Note (Signed)
Summary: [facility]  [facility]   Imported By: Sherian Rein 04/22/2010 12:56:21  _____________________________________________________________________  External Attachment:    Type:   Image     Comment:   External Document

## 2010-06-18 NOTE — Assessment & Plan Note (Signed)
[facility]                         GASTROENTEROLOGY OFFICE NOTE   NAME:[name], [name]                        MRN:          [id number]  DATE:05/05/2007                            DOB:          1960-07-05    CHIEF COMPLAINT:  Right upper quadrant pain, diarrhea.   [name] has irritable bowel syndrome.  For the past few months, she  has been having increasingly frequent stools.  She used to be  constipated.  She was taking a laxative once a week, stopped that and  then started having progressively increased frequency in stools after a  blow out weekly.  She had been having some right upper quadrant pain  radiating into the back, wonders if it is her pancreas.  The more severe  spells started after she had some beef ribs.   No fever or chills.  No cough, congestion, chest pain, respiratory  difficulty or genitourinary symptoms.   MEDICATIONS:  Listed and reviewed in the chart.  She is really on none  at this time.   PAST MEDICAL HISTORY AND PROBLEMS:  See my note of Jun 04, 2006, for  further details of that.  It is reviewed and unchanged.   Note that she is not really eating during the day.  She is mainly  drinking coffee and soda.  She is very busy at work as she works in [employer], and they have been overrun with requests to refinance,  etc.   PHYSICAL EXAMINATION:  Weight 136 pounds, pulse 68, blood pressure  102/62.  There is no CVA tenderness.  The lungs are clear.  HEART:  S1, S2, no murmurs or gallops.  ABDOMEN:  Soft.  I detect no significant tenderness or mass.  There are  no ribs tender.   ASSESSMENT:  Irritable bowel syndrome mixed or alternating type now in a  diarrhea phase.   Note, she is post cholecystectomy.   PLAN:  1. Levbid b.i.d.  2. Decrease caffeine.  3. Check LFTs, CBC, amylase, lipase to rule out other problems.  4. Low-fiber diet and try to add food back into her diet.  She has      been afraid of  having to get      up to go to the bathroom when she is at work, so she has not been      eating much.  She      should try some yogurt and food with pre or probiotics in them.  If      this does not work, she knows to call back to attempt further      antispasmodic therapy or office visit if needed.     Iva Boop, MD,FACG  Electronically Signed    CEG/MedQ  DD: 05/05/2007  DT: 05/05/2007  Job #: [id number]

## 2010-06-21 NOTE — Assessment & Plan Note (Signed)
[facility]                         GASTROENTEROLOGY OFFICE NOTE   NAME:[name], [name]                        MRN:          [id number]  DATE:03/30/2006                            DOB:          [birth year]    CHIEF COMPLAINT:  Constipation.   [name] returns.  Her colonoscopy was unrevealing.  I asked her to try  MiraLax once or twice a day and she says that is not working.  She has  used Dulcolax but she has had severe abdominal pain with that.  She says  this is not gastrointestinal pain, it is a very sharp, severe pain in  the infraumbilical area.  She has not vomited but she has been bloated  and distended, she says.  She wonders if it might be adhesions.  She has  had multiple abdominal surgeries previously.  Citrucel made her bloat as  well.  Medication list reviewed and unchanged, updated.  Weight 131  pounds, pulse 64, blood pressure 98/60.  Eyes anicteric.  Abdomen is  soft, mildly tender in the periumbilical area.  Bowel sounds are  present.  She is alert and oriented x3.  Skin is warm and dry without  acute rash in the areas inspected.   I have reviewed the colonoscopy findings with her.   ASSESSMENT:  Persistent constipation.  This is a change from post  cholecystectomy diarrhea that she had before.  She had a difficult time  taking her colonoscopy prep in which she vomited, but she had not really  vomited in these problems.  There does not appear to be any colonic  obstruction based upon the colonoscopy.  She has had multiple abdominal  surgeries and certainly scar tissue with adhesions could be causing some  sort of relative obstruction, perhaps.  Crohn's disease could be  possible but seems unlikely.   PLAN:  CT enterography will be undertaken.  This will allow to look for  causes of abdominal pain or any small-bowel obstruction or inflammatory  process in the small bowel.  I will call with those results.  If that is  unrevealing  then I think a trial of Amitiza makes sense.  A Sitzmarks  study may be necessary as well.     Iva Boop, MD,FACG  Electronically Signed    CEG/MedQ  DD: 03/30/2006  DT: 03/30/2006  Job #: [id number]   cc:   Sherron Monday, MD

## 2010-06-21 NOTE — H&P (Signed)
[facility]  Patient:    [name], [name]                        MRN: [id number] Adm. Date:  [id number] Attending:  Arlis Porta CC:         Luciana Axe, M.D.  Anselmo Rod, M.D.   History and Physical  REASON FOR ADMISSION:  Elective cholecystectomy.  HISTORY OF PRESENT ILLNESS:  The patient is a 50 year old female who had been having intermittent problems with abdominal pain for a long period of time. However, recently, she had been having classic biliary colic described as pressure-type epigastric, right upper quadrant pain radiating around to her back associated with nausea and vomiting. An ultrasound was performed that demonstrated 1 cm gallstone embedded in the neck of the gallbladder. It was done on November 04, 1999. She was reluctant to have this operation, because she had read some literature about sharp trocars and the danger of that. However, her symptomatic episodes have increased; and now, she presents for the operation.  PAST MEDICAL HISTORY: 1. Ovarian cyst. 2. Hormonal deficiency.  PAST SURGICAL HISTORY: 1. Tonsillectomy. 2. Removal of ectopic pregnancy. 3. Ovarian cystectomy. 4. TAH/BSO.  ALLERGIES:  None.  CURRENT MEDICATIONS:  Estrogen hormonal patch.  SOCIAL HISTORY:  She smokes a pack of cigarettes a day. She occasionally has an alcoholic beverage.  FAMILY HISTORY:  Positive for diabetes.  REVIEW OF SYSTEMS:  No chronic cardiovascular diseases. She does have some bronchitis intermittently.  PHYSICAL EXAMINATION:  GENERAL:  Well-developed, well-nourished female in no acute distress, pleasant and cooperative.  VITAL SIGNS:  Temperature is 98 degrees, blood pressure 112/70, pulse 64.  HEENT:  Eyes:  Extraocular motions intact. Sclerae clear.  NECK:  Supple without masses.  HEART:  Regular rate and rhythm without a murmur.  RESPIRATORY:  Breath sounds equal and clear. Respirations  nonlabored.  ABDOMEN:  Soft, nontender. There is a no distention. There is lower abdominal scar in the midline area. No palpable masses.  LABORATORY DATA:  Liver function tests within normal limits.  Chest x-ray:  No active disease.  IMPRESSION:  Symptomatic cholelithiasis.  PLAN:  Laparoscopic cholecystectomy. The procedure and risks have been explained to her extensively. She seemed to understand and agreed to proceed.DD:  01/23/00 TD:  01/23/00 Job: [id number]

## 2010-06-21 NOTE — Op Note (Signed)
[facility]  Patient:    [name], [name]                        MRN: [id number] Proc. Date: 01/23/00 Adm. Date:  [id number] Disc. Date: [id number] Attending:  Arlis Porta CC:         Luciana Axe, M.D.  Anselmo Rod, M.D.   Operative Report  PREOPERATIVE DIAGNOSIS:  Symptomatic cholelithiasis.  POSTOPERATIVE DIAGNOSIS:  Chronic calculus cholecystitis.  PROCEDURE:  Laparoscopic cholecystectomy.  SURGEON:  Dr. Abbey Chatters.  ASSISTANT:  Dr. Mosetta Anis.  ANESTHESIA:  General.  INDICATIONS FOR PROCEDURE:  This 50 year old female who has classic biliary colic and a 1 cm gallbladder impacted in the neck of the gallbladder. Liver function tests are normal and now she presents for cholecystectomy.  TECHNIQUE:  She was placed supine on the operating table and a general anesthetic was administered. Her abdomen was sterilely prepped and draped. She had a lower midline scar present. An incision was made in the subumbilical region and carried down to the fascia. A 1 cm incision was made in the fascia. A pursestring suture was placed around the fascial edges. The peritoneal cavity was then entered bluntly and under direct vision. A Hasson trocar was introduced to the peritoneal cavity and pneumoperitoneum created by insufflation of CO2 gas. Next, the laparoscope was introduced. I noticed adhesions of the omentum to the abdominal wall. I was able to identify the right upper quadrant. Under direct vision, an 11 mm trocar was placed through a similar size incision in the epigastrium and two 5 mm trocars placed in the right abdomen with similar size incisions. Examining the lower abdomen demonstrated adhesions of the omentum to the abdominal wall and in the lower pelvis area of the small intestine to the abdominal wall.  Next, the fundus of the gallbladder was grasped. The adhesions between the omentum and the gallbladder and the duodenum and  gallbladder were taken down bluntly. The fundus was able to be retracted up toward the right shoulder. The infundibulum was grasped and retracted laterally. I mobilized the infundibulum completely. I subsequently identified the anterior branch of the cystic artery, clipped it and divided it. The cystic duct was small and identified at its junction with the gallbladder. It was clipped 3 times proximally and once distally and divided sharply. The posterior branch of the cystic artery was clipped and divided. The gallbladder was then dissected free from the liver bed intact using the cautery. The liver bed was irrigated and inspected and bleeding points controlled with cautery. The area was irrigated and inspected once again and there was no evidence of bleeding or bile leak.  The gallbladder was subsequently removed through the subumbilical port and the gallstone palpated and impacted in the neck of the gallbladder. The subumbilical incision was then closed by tightening up and tying down the pursestring suture thus approximating the fascia.  Next, the right upper quadrant area was examined and irrigation was evacuated. Again there was no bleeding or bile leakage noted. The trocars were all removed and pneumoperitoneum released.  The skin incisions were then closed with 4-0 monocryl subcuticular sutures followed by Steri-Strips and sterile dressings. The patient tolerated the procedure well without any apparent complications and was taken to the recovery room in satisfactory condition. DD:  01/23/00 TD:  01/24/00 Job: [id number]

## 2010-06-21 NOTE — Assessment & Plan Note (Signed)
[facility]                                 ON-CALL NOTE   NAME:[name], [name]                          MRN:          [id number]  DATE:02/12/2006                            DOB:          1960-10-04    ON CALL NOTE:  Home phone # [phone].   TIME OF THE CALL:  09:11 p.m.   HISTORY:  The patient called this evening having difficulties with her  colonoscopy prep.  She was using the Osmo Prep.  She was only able to  take 17 tablets from the first portion of the prep.  She is complaining  of vomiting.  She tried MAGNESIUM Citrate but vomited that as well.  She  is having bowel movements.  I advised her to stop taking the prep at  this point and try to resume the second portion of the Osmo Prep, as  planned, hoping that after her stomach settles, she might tolerate this  better.  However, if she does not tolerate this, then I have advised her  to go to the pharmacy in the morning and purchase 2 Fleets enemas to be  administered 1 hour prior to arriving to the endoscopy center.     Wilhemina Bonito. Marina Goodell, MD  Electronically Signed    JNP/MedQ  DD: 02/12/2006  DT: 02/13/2006  Job #: [id number]   cc:   Iva Boop, MD,FACG

## 2010-06-21 NOTE — Assessment & Plan Note (Signed)
[facility]                         GASTROENTEROLOGY OFFICE NOTE   NAME:Level, [name]                        MRN:          [id number]  DATE:06/04/2006                            DOB:          06-18-1960    CHIEF COMPLAINT:  Follow up of constipation and abdominal pain.   PROBLEMS:  1. Constipation, persistent.  Colonoscopy on February 13, 2006 was      normal.  CT enterography normal.  Lack of response to Amitiza and      MiraLax.  2. Back pain associated with constipation as well as right-sided      abdominal and infraumbilical pain.  3. Prior cholecystectomy in 2002.  4. Prior hysterectomy and bilateral salpingo-oophorectomy.  5. Rectocele and cystocele.  6. Prior appendectomy.  7. Tubal pregnancy in 1983.  8. Adhesions with presumed adhesiolysis 1985.  9. Insomnia.  10.Drug ALLERGY TO PENICILLIN.   MEDICATIONS:  Aleve and intermittent laxatives.   HISTORY:  I saw [name] last in February.  We did the CT enterography  and tried Amitiza.  She took that for five days and felt bloated and  distended so stopped it.  She is still moving her bowels very  infrequently and using Dulcolax, which can cause abdominal pain.  She  has not tried enemas.  Her weight is stable.  She is switching  gynecologists regarding her rectocele and cystocele.  She was unhappy  with her previous gynecologist.   Weight 131 pounds.  Abdomen is soft, minimally tender in the right lower  quadrant and infraumbilical area.   ASSESSMENT:  Chronic constipation of unclear etiology, presumed  functional/irritable bowel syndrome.  It sounds like slow transit  constipation.  Question component of rectocele contributing, though it  seems unlikely to me.   PLAN:  Sitzmarks study.  Pending that, she may need to resort to enemas.  She has tried all of the typical medications and we may have to go to  something like colchicine perhaps.  I do not think any other structural  studies would be indicate, though anorectal manometry could be  indicated, I would need to get that done at a tertiary center.     Julia Boop, MD,FACG  Electronically Signed   CEG/MedQ  DD: 06/04/2006  DT: 06/04/2006  Job #: [id number]

## 2010-06-21 NOTE — Assessment & Plan Note (Signed)
[facility]                         GASTROENTEROLOGY OFFICE NOTE   Julia, COTHERN                        MRN:          [id number]  DATE:02/05/2006                            DOB:          August 19, 1960    REFERRING PHYSICIAN:  Sherron Monday, MD   REASON FOR CONSULTATION:  Change in bowel habits, abdominal pain.   ASSESSMENT:  A 50 year old white woman with a 66-month history of  worsening constipation problems.  She used to have post cholecystectomy  diarrhea and urgent stools.  Over the past 7-8 months, she has had more  constipation and has been without defecation for up to 2 weeks.  This is  new.  There are not chronic bowel habit changes or constipation  otherwise.  She does have a rectocele, but this is a different sort of  problem.   RECOMMENDATIONS:  1. Colonoscopy.  2. Request labs that Dr. Ellyn Hack at [facility] obtained in      the fall to check on her thyroid, electrolytes, etc.  3. Further plans pending the above.  The colonoscopy is to investigate      for tumor or other obstructing lesion that could cause these      problems.  In the interim, she can use some MiraLax 1-2 doses a      day.  She has tried that without benefit, but only took it for 3      days.   HISTORY:  A 50 year old white woman who said 7 or 8 months ago she  started having harder stools, gas and bloating, and then developed the  constipation like I described above.  She used to have urgent defecation  after her cholecystectomy in 2002, but then in 2007 things changed.  She  has a stabbing left lower quadrant pain that is helped by defecation.  There is a chronic back pain that is helped but not relieved by  defecation.  She has tried stool softener without benefit.  She tried  the MiraLax as described.  She tried fiber; that made her bloated and  worse.  She does not describe bleeding.  She apparently was to have a  colonoscopy at [facility], but this was canceled for some  reason, but not rescheduled.  She says her gynecologist is considering  rectocele and cystocele repair, as well.   PAST MEDICAL HISTORY:  1. As above.  2. Cholecystectomy in 2002.  3. Hysterectomy and bilateral salpingo-oophorectomy.  4. Appendectomy.  5. Tubal pregnancy in 1983.  6. Adhesions with presumed adhesiolysis in 1985.   FAMILY HISTORY:  No colon cancer reported.   MEDICATIONS:  1. Rozerem nightly.  2. Aleve.  3. Laxatives p.r.n.  She uses Dulcolax once a week or so.   SOCIAL HISTORY:  She is divorced.  She lives alone.  One daughter.  She  is working in the [employer].  She does smoke.  No other  drugs, tobacco or alcohol.   REVIEW OF SYSTEMS:  See medical history form.  She has pain in the back  radiating to the buttocks,  but not the lower extremity.  She feels  tired.  All other systems are negative or as mentioned in the medical  history form.   PHYSICAL EXAMINATION:  GENERAL:  Well-developed, well-nourished white  woman.  VITAL SIGNS:  Height 5 feet, 5 inches, weight 129 pounds.  Blood  pressure 82/52, pulse 58.  HEENT:  Eyes - anicteric.  ENT - normal mouth and posterior pharynx.  NECK:  Supple.  No thyromegaly or mass.  CHEST:  Clear.  HEART:  S1 and S2.  No murmurs, rubs, or gallops.  ABDOMEN:  Soft.  She has some minimal tenderness in the left lower  quadrant without organomegaly or mass.  RECTAL:  Deferred.  LYMPHATIC:  No neck or supraclavicular nodes.  EXTREMITIES:  No edema.  SKIN:  Warm, dry.  No rash.  NEUROLOGIC/PSYCHIATRIC:  She is alert and oriented x3.   I appreciate the opportunity to care for this patient.     Julia Boop, MD,FACG  Electronically Signed    CEG/MedQ  DD: 02/05/2006  DT: 02/05/2006  Job #: [id number]   cc:   Sherron Monday, MD

## 2010-10-31 LAB — POCT URINALYSIS DIP (DEVICE)
Bilirubin Urine: NEGATIVE
Glucose, UA: NEGATIVE
Hgb urine dipstick: NEGATIVE
Ketones, ur: NEGATIVE
Specific Gravity, Urine: 1.01

## 2010-10-31 LAB — POCT PREGNANCY, URINE
Operator id: 126491
Preg Test, Ur: NEGATIVE

## 2011-07-11 ENCOUNTER — Ambulatory Visit (HOSPITAL_COMMUNITY)
Admission: RE | Admit: 2011-07-11 | Discharge: 2011-07-11 | Disposition: A | Discharge status: Home / Self Care | Payer: BC Managed Care – PPO | Source: Ambulatory Visit | Attending: Physician Assistant | Admitting: Physician Assistant

## 2011-07-11 ENCOUNTER — Other Ambulatory Visit (HOSPITAL_COMMUNITY): Payer: Self-pay | Admitting: Physician Assistant

## 2011-07-11 DIAGNOSIS — M542 Cervicalgia: Secondary | ICD-10-CM | POA: Insufficient documentation

## 2011-07-11 DIAGNOSIS — M79609 Pain in unspecified limb: Secondary | ICD-10-CM | POA: Insufficient documentation

## 2011-07-11 DIAGNOSIS — M5030 Other cervical disc degeneration, unspecified cervical region: Secondary | ICD-10-CM | POA: Insufficient documentation

## 2011-10-14 ENCOUNTER — Other Ambulatory Visit: Payer: Self-pay | Admitting: Physician Assistant

## 2011-10-23 ENCOUNTER — Encounter: Payer: Self-pay | Admitting: Internal Medicine

## 2011-10-23 ENCOUNTER — Other Ambulatory Visit (INDEPENDENT_AMBULATORY_CARE_PROVIDER_SITE_OTHER): Payer: BC Managed Care – PPO

## 2011-10-23 ENCOUNTER — Ambulatory Visit (INDEPENDENT_AMBULATORY_CARE_PROVIDER_SITE_OTHER): Payer: BC Managed Care – PPO | Admitting: Internal Medicine

## 2011-10-23 VITALS — BP 120/74 | HR 76 | Ht 64.0 in | Wt 140.0 lb

## 2011-10-23 DIAGNOSIS — R1012 Left upper quadrant pain: Secondary | ICD-10-CM

## 2011-10-23 DIAGNOSIS — R1011 Right upper quadrant pain: Secondary | ICD-10-CM

## 2011-10-23 DIAGNOSIS — K589 Irritable bowel syndrome without diarrhea: Secondary | ICD-10-CM

## 2011-10-23 LAB — CBC WITH DIFFERENTIAL/PLATELET
Eosinophils Relative: 1.7 % (ref 0.0–5.0)
Lymphocytes Relative: 46.6 % — ABNORMAL HIGH (ref 12.0–46.0)
Lymphs Abs: 3.1 10*3/uL (ref 0.7–4.0)
Monocytes Absolute: 0.3 10*3/uL (ref 0.1–1.0)
Monocytes Relative: 4.3 % (ref 3.0–12.0)
Neutrophils Relative %: 47.1 % (ref 43.0–77.0)
Platelets: 212 10*3/uL (ref 150.0–400.0)
RBC: 4.55 Mil/uL (ref 3.87–5.11)
RDW: 12.9 % (ref 11.5–14.6)
WBC: 6.6 10*3/uL (ref 4.5–10.5)

## 2011-10-23 LAB — COMPREHENSIVE METABOLIC PANEL
ALT: 21 U/L (ref 0–35)
BUN: 6 mg/dL (ref 6–23)
CO2: 29 mEq/L (ref 19–32)
Calcium: 9.3 mg/dL (ref 8.4–10.5)
Total Bilirubin: 0.8 mg/dL (ref 0.3–1.2)

## 2011-10-23 NOTE — Patient Instructions (Addendum)
Your physician has requested that you go to the basement for the following lab work before leaving today: CBC, Cmet, Lipase, Amylase, Pancreatic Fecal Elastase. We will call you with results of these blood and stool tests.

## 2011-10-23 NOTE — Progress Notes (Signed)
Subjective:    Patient ID: [name], female    DOB: 1960-10-26, 51 y.o.   MRN: [id number]  HPI This patient returns in followup, she has a long history of chronic recurrent upper abdominal pain mostly. She has IBS with chronic alternating bowel habits. She had been doing reasonably well with about 2 spells of abdominal pain a month at most, relieved by sublingual hyoscyamine. She had some occasional episodes of vomiting, last in June and only about once a month at the most. For her, wife was reasonably satisfactory. However in March she began has shoulder pains, and the muscles diagnosed as a myofascial pain syndrome. She's had 2 rounds of prednisone, sound like 10-14 days, she's use some rare hydrocodone with acetaminophen, and has also had some trigger point injections. There have been 2 spells of that, I think in the spring and then more recently. Over the past few weeks, she's had daily pain and tach up to 5 hours, with intense nausea. This is similar to what she's experienced in the past. There's been no change in her chronic alternating bowel habit with this. She does say the pain she is having now is that is being in the right upper quadrant radiates across the upper abdomen and into the back. It is intense at times.  This is similar to but worse since she's experienced in the past and she is concerned that she may have an undiagnosed problem. She was seen at [facility] last year, and advised to take Librax, she was not satisfied with that experience.  She has not lost weight. Wt Readings from Last 3 Encounters:  10/23/11 140 lb (63.504 kg)  12/10/09 139 lb (63.05 kg)  08/20/09 136 lb 6.1 oz (61.862 kg)   She remains perplexed and bothered by this and very frustrated by the lack of a specific diagnosis p.m. what we have given her. She brings a list of questions about whether or not she might have pancreatitis, sphincter of OD dysfunction or some other type of biliary problem. Over the  years she has had CT enterography, regular CT scanning of the abdomen and pelvis and ultrasound. She has never had a dilated bile duct and I do not think she's had abnormal liver chemistries. He does not have steatorrhea though believes fatty foods bother her and she has portal fatty greasy foods in the last several days and has not been sick since. Allergies  Allergen Reactions  . Penicillins    Outpatient Prescriptions Prior to Visit  Medication Sig Dispense Refill  . hyoscyamine (LEVSIN SL) 0.125 MG SL tablet DISSOLVE 1 TABLET UNDER TONGUE EVERY 4 TO 6 HOURS AS NEEDED FOR ABDOMINAL DISCOMFORT  20 tablet  1   Past Medical History  Diagnosis Date  . IBS (irritable bowel syndrome)     mixed  . Chronic constipation   . Migraine headache   . Insomnia   . Rectocele   . Cystocele   . Myofascial pain syndrome, cervical   . Abdominal pain, chronic, right upper quadrant    Past Surgical History  Procedure Date  . Esophagogastroduodenoscopy   . Colonoscopy   . Appendectomy   . Cholecystectomy 2002  . Total abdominal hysterectomy w/ bilateral salpingoophorectomy   . Adhesiolysis   . Tonsillectomy      Review of Systems As above. Denies urinary symptoms. Denies new stressors.    Objective:   Physical Exam General:  NAD Eyes:   anicteric Lungs:  clear Heart:  S1S2 no  rubs, murmurs or gallops Abdomen:  soft and nontender, BS+ no hepatosplenomegaly or mass   Data Reviewed:  Previous records as outlined in the history     Assessment & Plan:       1. Bilateral upper abdominal pain   2. IBS (irritable bowel syndrome)    1. I think this is an exacerbation of problems she has had before. Think she has functional abdominal pain. White increased recently on not sure, could of been from the steroids. 2. She is concerned about the possibility of sphincter of OD dysfunction or chronic recurrent pancreatitis which seems very unlikely to me. I explained why, in that she's never had  any imaging abnormalities of these areas and she has had normal bile duct size, and no LFT abnormalities. 3. We'll go ahead and check CBC, comprehensive metabolic panel, lipase, amylase and a fecal elastase. 4. Pending those consider repeat imaging which could include CT scanning. Also consider referral to a tertiary center for evaluation, though I cautioned her I think that the risks of intervention with ERCP and biliary manometry probably outweigh the benefits, I understand her frustration and she may need to speak to an expert about this to satisfy her concerns.  I appreciate the opportunity to care for this patient.   CC: Nadean Corwin, MD

## 2011-10-23 NOTE — Progress Notes (Signed)
Quick Note:  Labs are all normal - very good news though I understand she is frustrated that there is not a specific "fixable" abnormality  I would see how she does and limit fatty foods - can try some  I have a stool test pending about the pancreas and when that returns we will notify (fecal elastase) ______

## 2011-10-31 ENCOUNTER — Other Ambulatory Visit (HOSPITAL_COMMUNITY): Payer: Self-pay | Admitting: Internal Medicine

## 2011-10-31 ENCOUNTER — Other Ambulatory Visit: Payer: BC Managed Care – PPO

## 2011-10-31 ENCOUNTER — Ambulatory Visit (HOSPITAL_COMMUNITY)
Admission: RE | Admit: 2011-10-31 | Discharge: 2011-10-31 | Disposition: A | Discharge status: Home / Self Care | Payer: BC Managed Care – PPO | Source: Ambulatory Visit | Attending: Internal Medicine | Admitting: Internal Medicine

## 2011-10-31 DIAGNOSIS — F17200 Nicotine dependence, unspecified, uncomplicated: Secondary | ICD-10-CM

## 2011-10-31 DIAGNOSIS — R091 Pleurisy: Secondary | ICD-10-CM | POA: Insufficient documentation

## 2011-10-31 DIAGNOSIS — I10 Essential (primary) hypertension: Secondary | ICD-10-CM | POA: Insufficient documentation

## 2011-10-31 DIAGNOSIS — Z1231 Encounter for screening mammogram for malignant neoplasm of breast: Secondary | ICD-10-CM

## 2011-10-31 DIAGNOSIS — R1012 Left upper quadrant pain: Secondary | ICD-10-CM

## 2011-11-12 NOTE — Progress Notes (Signed)
Quick Note:  Let her know that pancreas test has finally returned and shows normal pancreas function.  Other labs were ok  How is she? ______

## 2011-11-13 ENCOUNTER — Ambulatory Visit (HOSPITAL_COMMUNITY)
Admission: RE | Admit: 2011-11-13 | Discharge: 2011-11-13 | Disposition: A | Discharge status: Home / Self Care | Payer: BC Managed Care – PPO | Source: Ambulatory Visit | Attending: Internal Medicine | Admitting: Internal Medicine

## 2011-11-13 ENCOUNTER — Other Ambulatory Visit: Payer: Self-pay

## 2011-11-13 ENCOUNTER — Other Ambulatory Visit: Payer: Self-pay | Admitting: Physician Assistant

## 2011-11-13 DIAGNOSIS — Z1231 Encounter for screening mammogram for malignant neoplasm of breast: Secondary | ICD-10-CM

## 2011-11-13 DIAGNOSIS — N63 Unspecified lump in unspecified breast: Secondary | ICD-10-CM

## 2011-11-13 MED ORDER — HYOSCYAMINE SULFATE ER 0.375 MG PO TBCR: 0.3750 mg | EXTENDED_RELEASE_TABLET | Freq: Two times a day (BID) | ORAL | Status: DC

## 2011-11-13 NOTE — Progress Notes (Signed)
Quick Note:  I recommend changing to hyoscyamine 0.375 mg bid on a regular basis - this should help prevent the abdominal pain from occurring  # 60 , 11 refills  Call back/follow-up prn if not helped ______

## 2011-11-17 ENCOUNTER — Other Ambulatory Visit: Payer: BC Managed Care – PPO

## 2011-12-08 ENCOUNTER — Other Ambulatory Visit: Payer: BC Managed Care – PPO

## 2011-12-12 ENCOUNTER — Other Ambulatory Visit: Payer: Self-pay | Admitting: Pain Medicine

## 2011-12-12 DIAGNOSIS — M25511 Pain in right shoulder: Secondary | ICD-10-CM

## 2011-12-12 DIAGNOSIS — M542 Cervicalgia: Secondary | ICD-10-CM

## 2011-12-20 ENCOUNTER — Other Ambulatory Visit: Payer: BC Managed Care – PPO

## 2012-01-06 ENCOUNTER — Encounter: Payer: Self-pay | Admitting: Internal Medicine

## 2012-06-26 IMAGING — CR DG CERVICAL SPINE COMPLETE 4+V
5 series · 5 of 5 positions shown · non-contrast
Comparison: None.

CLINICAL DATA: Neck pain, no acute injury

CERVICAL SPINE - COMPLETE 4+ VIEW

[view not recorded (1 of 5)]
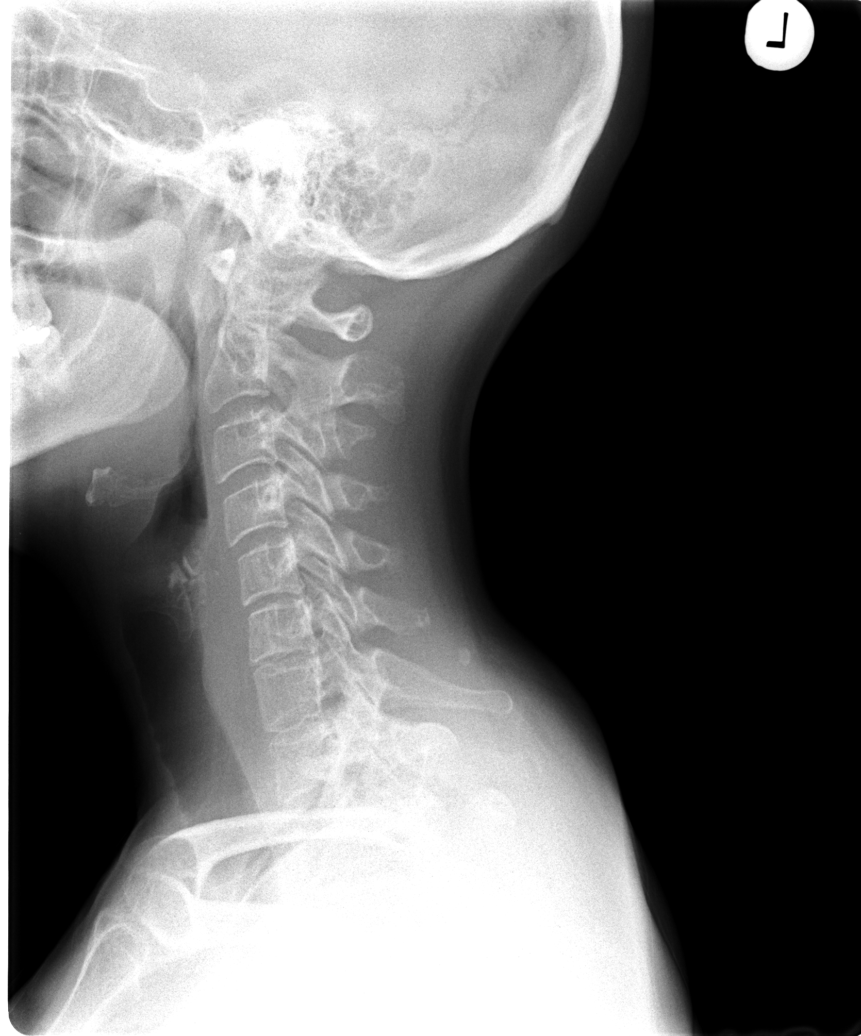

[view not recorded (2 of 5)]
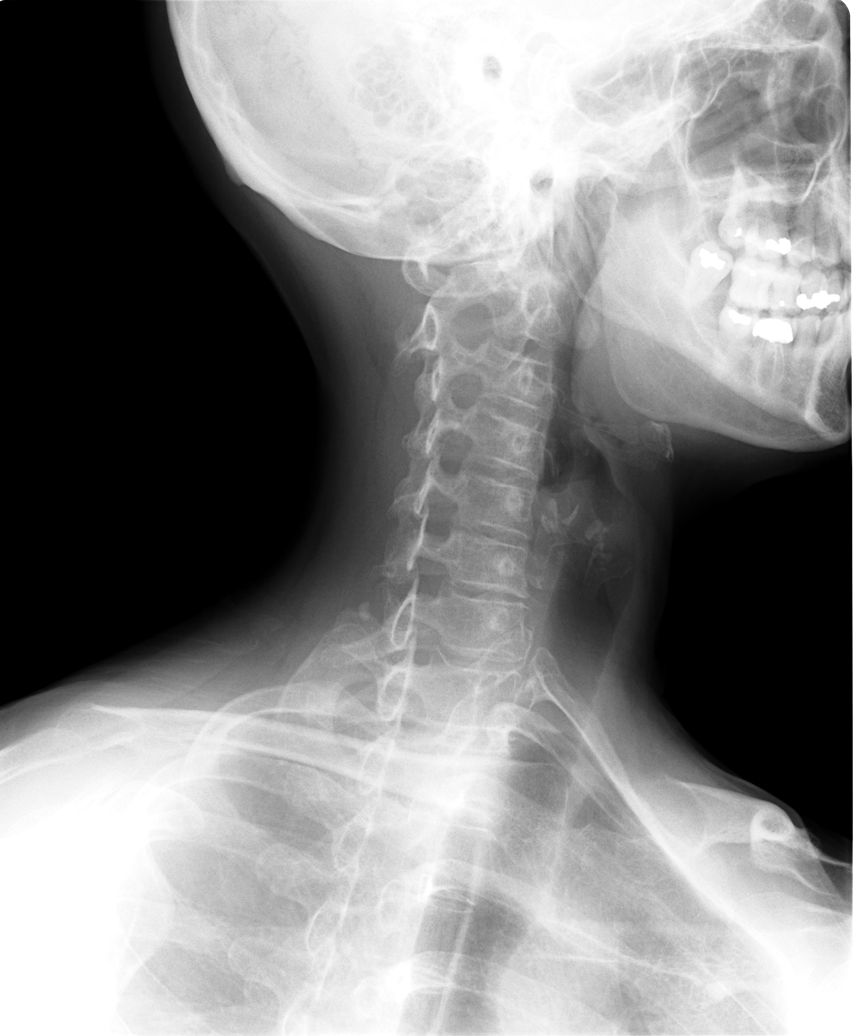

[view not recorded (3 of 5)]
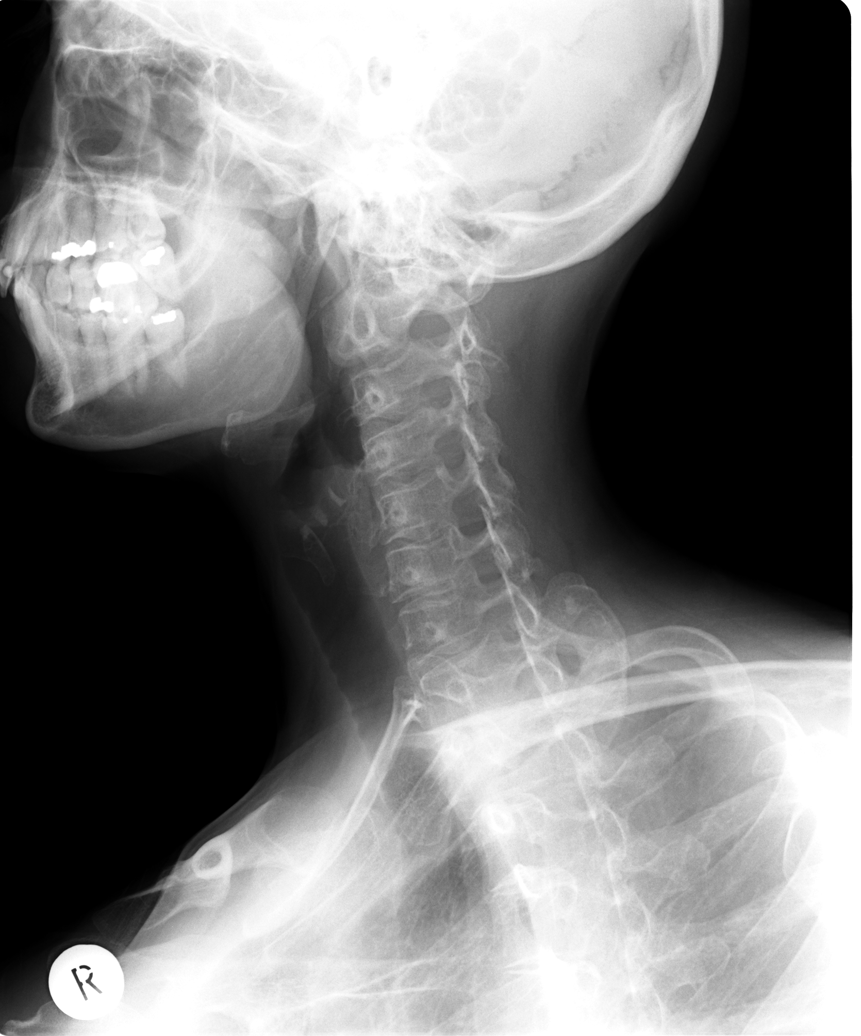

[view not recorded (4 of 5)]
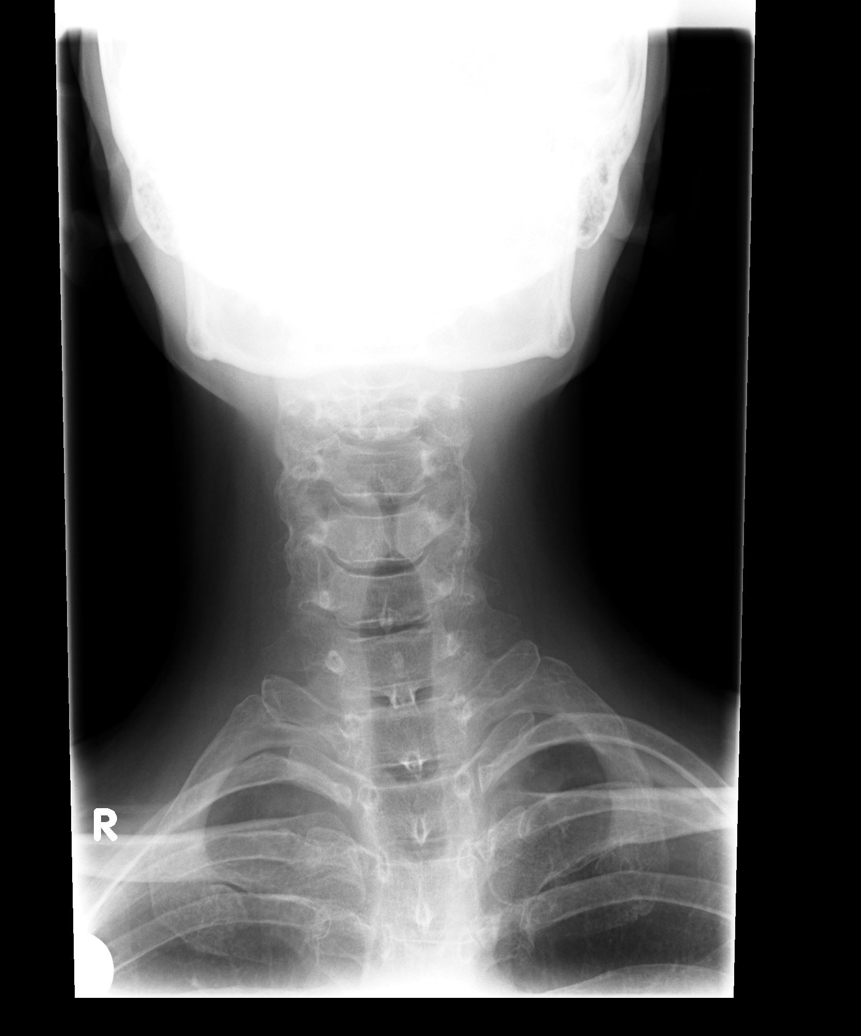

[view not recorded (5 of 5)]
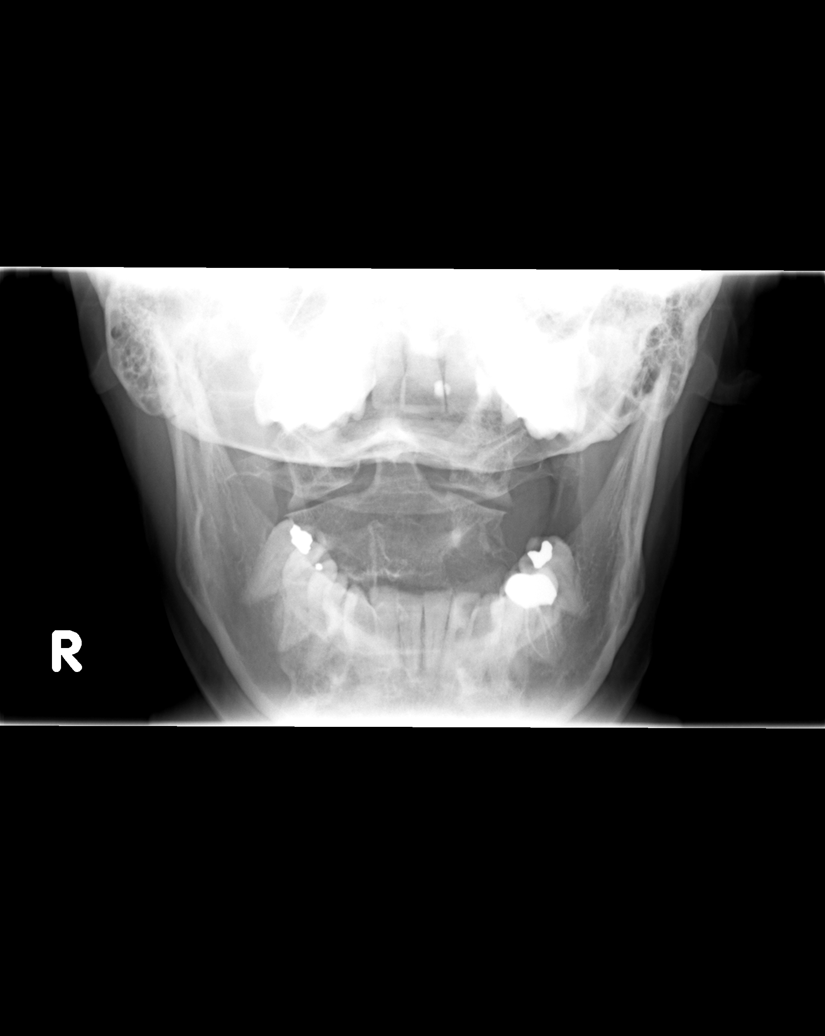

[5 of 5 positions shown; findings below may reference images not displayed]

FINDINGS: The cervical vertebrae are straightened in alignment.
There is mild degenerative disc disease at C5-6 and C6-7 levels
with slight loss of disc space.  No prevertebral soft tissue
swelling is seen.  On oblique views the foramina are patent.  The
odontoid process is intact.  The lung apices are clear.
IMPRESSION: Straightened alignment with mild degenerative disc disease at C5-6
and C6-7.

## 2012-10-01 ENCOUNTER — Encounter: Payer: Self-pay | Admitting: Internal Medicine

## 2012-10-16 IMAGING — CR DG CHEST 2V
2 series · 2 of 2 positions shown · non-contrast
Comparison: None.

CLINICAL DATA: Smoking history one pack per day.  No current chest
complaints

CHEST - 2 VIEW

[view not recorded (1 of 2)]
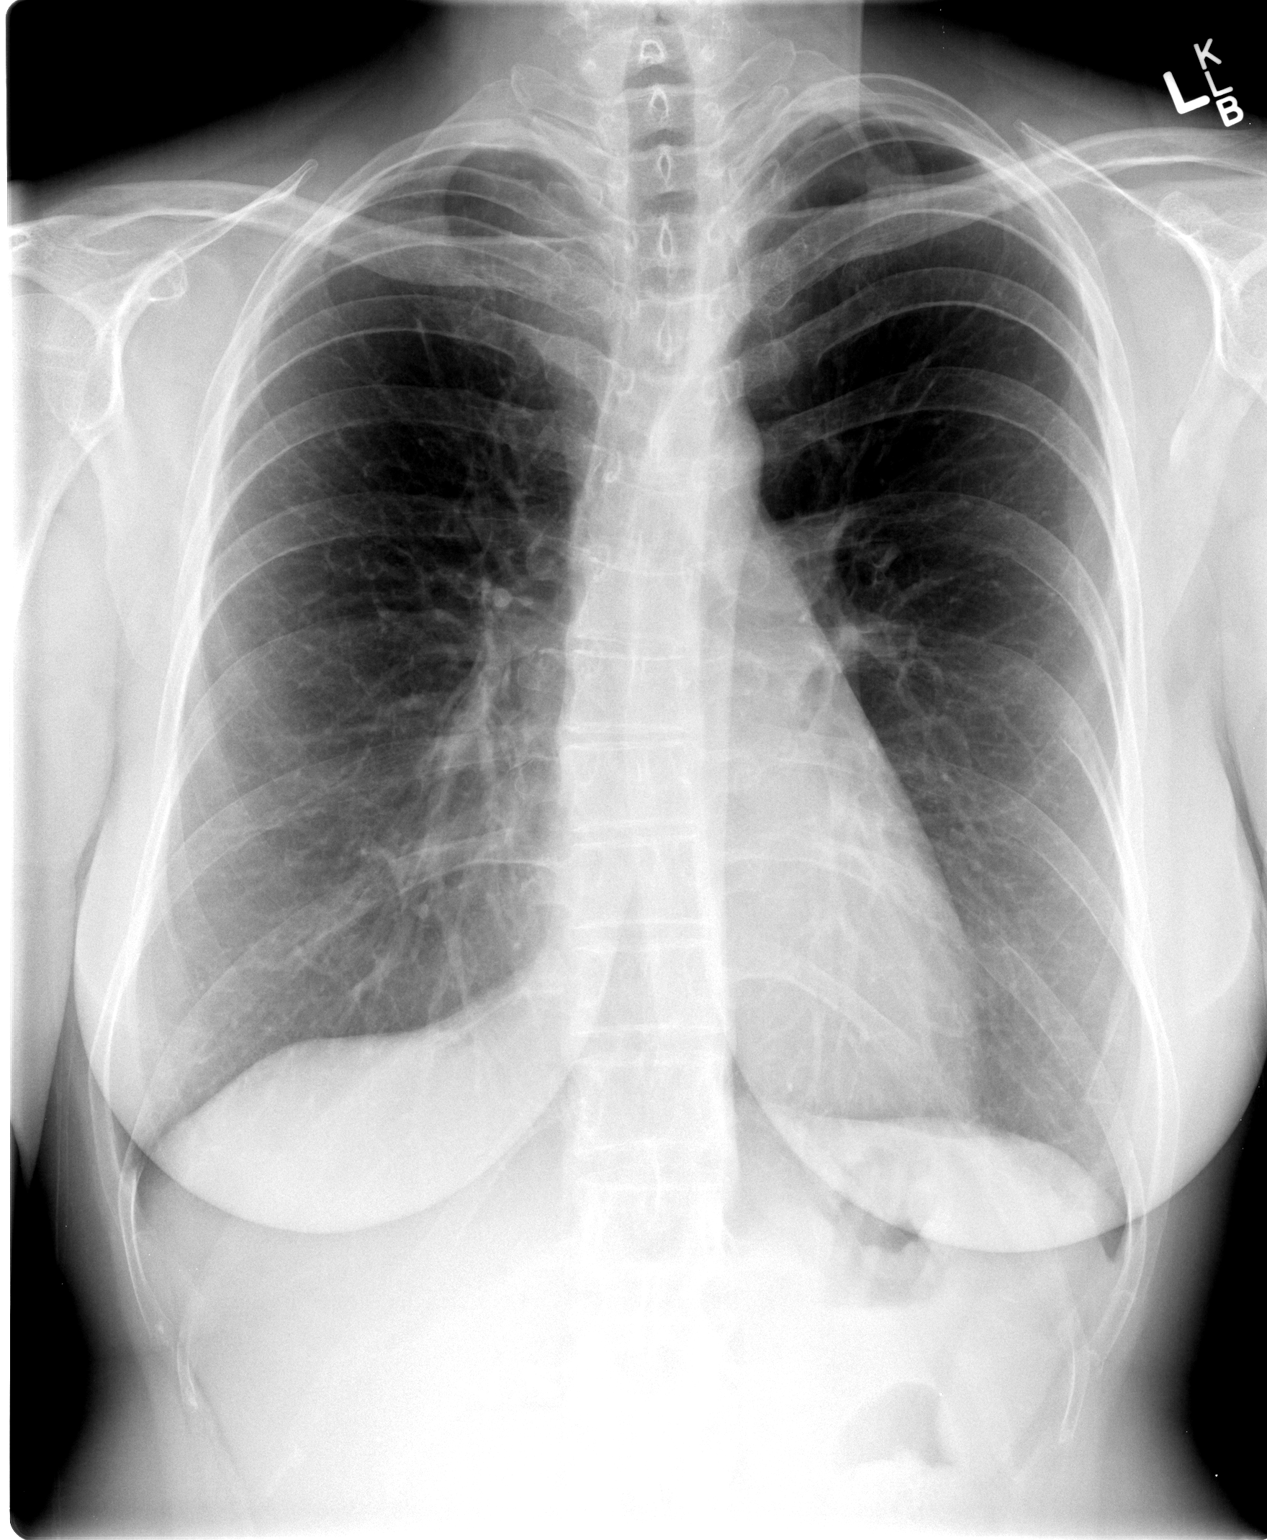

[view not recorded (2 of 2)]
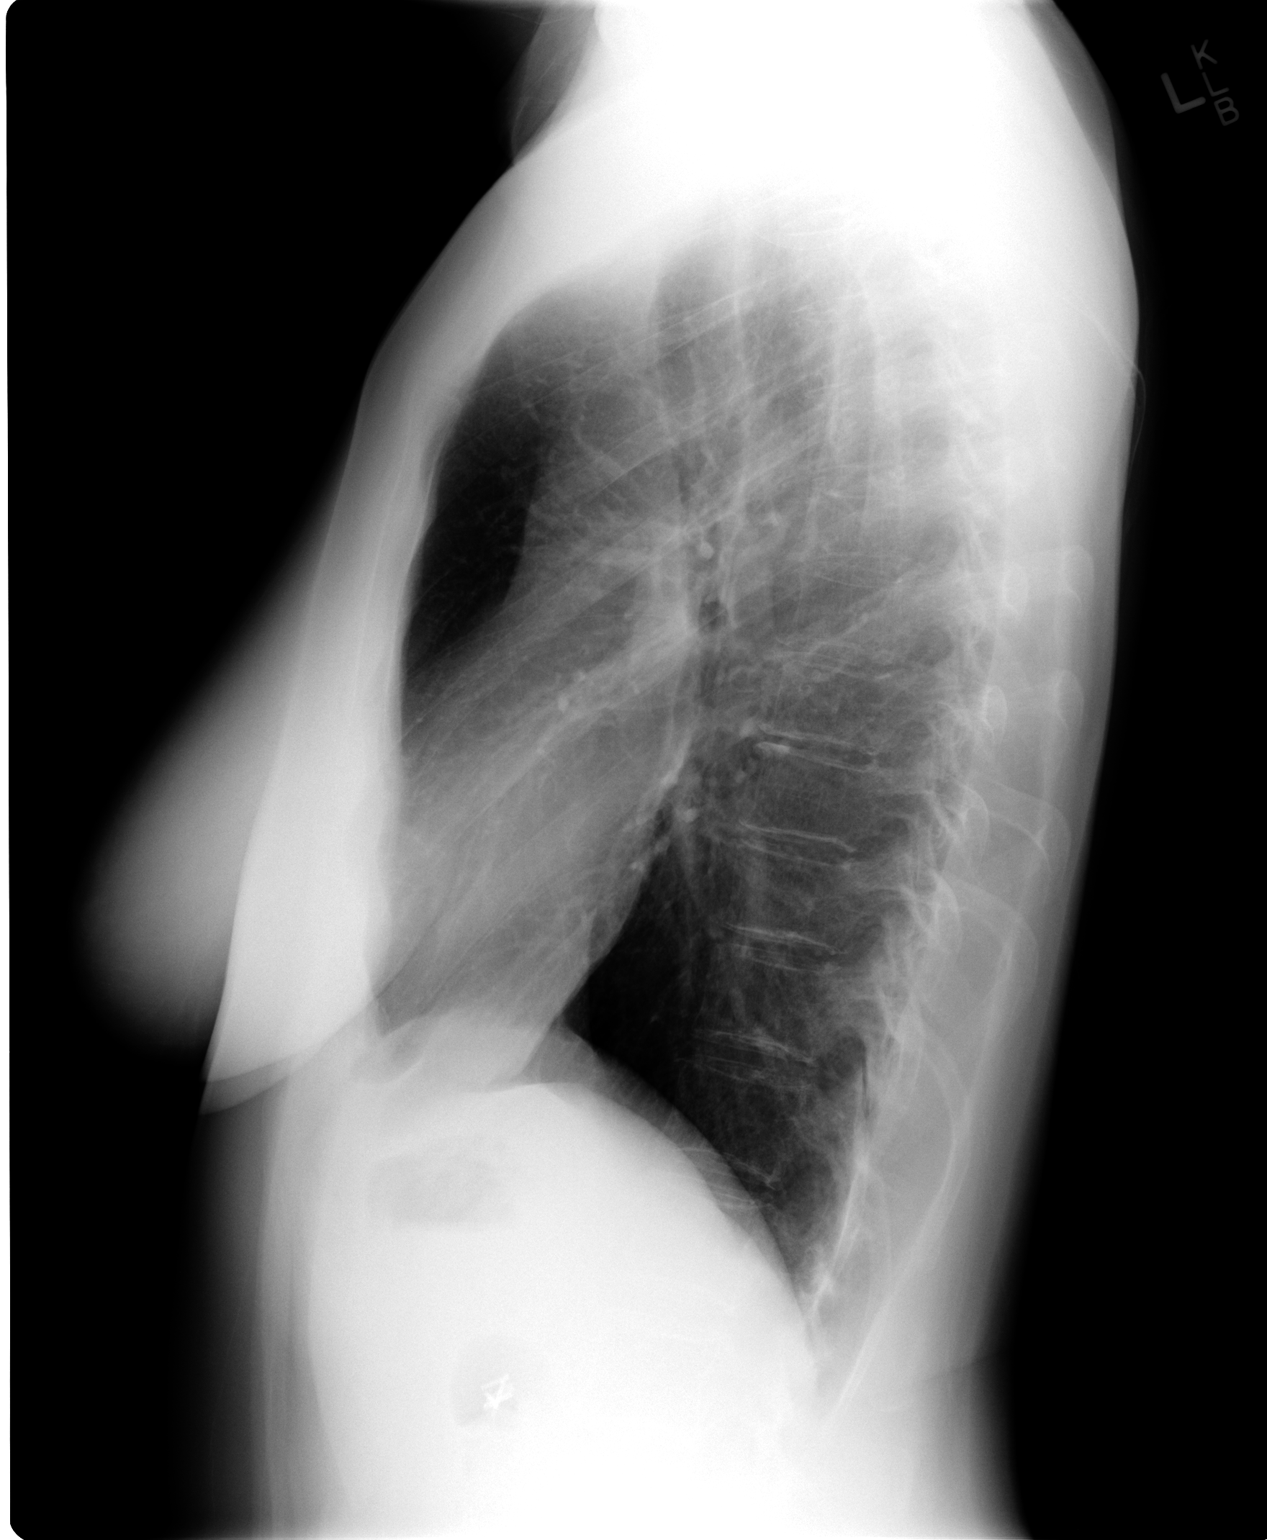

[2 of 2 positions shown; findings below may reference images not displayed]

FINDINGS: Heart and mediastinal contours are within normal limits.
The lung fields appear clear with no signs of focal infiltrate or
congestive failure.  Mild bilateral apical pleural thickening is
seen.  No pleural effusions or significant peribronchial cuffing is
identified.

Bony structures are notable for mild curvature of the thoracic
spine and are otherwise intact.  Surgical clips are noted in the
right upper quadrant.
IMPRESSION: No worrisome focal or acute abnormality identified

## 2013-01-06 ENCOUNTER — Other Ambulatory Visit: Payer: Self-pay | Admitting: Internal Medicine

## 2014-08-09 ENCOUNTER — Encounter: Payer: Self-pay | Admitting: Internal Medicine

## 2014-09-20 ENCOUNTER — Other Ambulatory Visit: Payer: Self-pay | Admitting: Nurse Practitioner

## 2015-03-30 ENCOUNTER — Encounter: Payer: Self-pay | Admitting: Internal Medicine

## 2015-03-30 ENCOUNTER — Ambulatory Visit (INDEPENDENT_AMBULATORY_CARE_PROVIDER_SITE_OTHER): Payer: PRIVATE HEALTH INSURANCE | Admitting: Internal Medicine

## 2015-03-30 VITALS — BP 120/90 | HR 80 | Ht 64.0 in | Wt 151.4 lb

## 2015-03-30 DIAGNOSIS — K589 Irritable bowel syndrome without diarrhea: Secondary | ICD-10-CM | POA: Diagnosis not present

## 2015-03-30 DIAGNOSIS — R1012 Left upper quadrant pain: Secondary | ICD-10-CM

## 2015-03-30 MED ORDER — HYOSCYAMINE SULFATE 0.125 MG SL SUBL: 0.1250 mg | SUBLINGUAL_TABLET | SUBLINGUAL | Status: DC | PRN

## 2015-03-30 NOTE — Patient Instructions (Addendum)
You have been given a separate informational sheet regarding your tobacco use, the importance of quitting and local resources to help you quit.   We have sent the following medications to your pharmacy for you to pick up at your convenience: Levsin SL  Follow up with [place] as needed.   We are putting you into our system for a 02/2016 colon recall.    I appreciate the opportunity to care for you. Silvano Rusk, MD, [facility]

## 2015-03-30 NOTE — Progress Notes (Signed)
   Subjective:    Patient ID: [name], female    DOB: April 27, 1960, 55 y.o.   MRN: [id number] Chief complaint: Left Upper quadrant pain  HPI The patient is here. I know her from a long history of intermittent abdominal pain that is relieved by hyoscyamine. Mostly she's had right upper quadrant pain and some epigastric. More recently she developed left upper quadrant pain radiating to the back. This is a new location but is very similar to previous episodes and problems were she'll get acute onset of pain that radiates into the back and is relieved by hyoscyamine. She has some old hyoscyamine and the pain is relieved by that but she would like a refill. Somehow she got 0.375 mg from the pharmacy at one point but she once the sublingual.  Wt Readings from Last 3 Encounters:  03/30/15 151 lb 6.4 oz (68.675 kg)  10/23/11 140 lb (63.504 kg)  12/10/09 139 lb (63.05 kg)   Appetite is good overall. She has been diagnosed with myofascial pain syndrome since I last saw her in 2013, she gets some sort of injections and uses low-dose Flexeril for relief for that, she has symptoms of anus and stiffness and muscle tension in the neck and shoulder area. Medications, allergies, past medical history, past surgical history, family history and social history are reviewed and updated in the EMR.  Review of Systems As above    Objective:   Physical Exam BP 120/90 mmHg  Pulse 80  Ht 5\' 4"  (1.626 m)  Wt 151 lb 6.4 oz (68.675 kg)  BMI 25.98 kg/m2 No acute distress Abdomen is soft nontender no organomegaly or mass She has an appropriate mood and affect  some muscle tension in the trapezius muscles palpated  Data reviewed includes a normal EGD in 2011 and 2013 GI notes     Assessment & Plan:   1. IBS (irritable bowel syndrome)   2. LUQ pain    Asked if she could have splenic flexure syndrome and at this point that is certainly possible. She has a new location of pain but she's had an extensive history  of intermittent abdominal pain that responds the hyoscyamine and since it is we will continue that. Anything changes otherwise could need other workup with imaging or endoscopy but not at this time. She had a colonoscopy in 2008, January of that year. We will plan for a routine repeat screening colonoscopy in January 2018 approximately.

## 2016-02-18 ENCOUNTER — Encounter: Payer: Self-pay | Admitting: Internal Medicine

## 2016-06-05 ENCOUNTER — Telehealth: Payer: Self-pay | Admitting: *Deleted

## 2016-06-05 NOTE — Telephone Encounter (Signed)
PreVisit Call attempted. Left VM. 

## 2016-06-06 ENCOUNTER — Encounter: Payer: Self-pay | Admitting: Family Medicine

## 2016-06-06 ENCOUNTER — Ambulatory Visit (INDEPENDENT_AMBULATORY_CARE_PROVIDER_SITE_OTHER): Payer: 59 | Admitting: Family Medicine

## 2016-06-06 VITALS — Temp 98.0°F | Wt 145.0 lb

## 2016-06-06 DIAGNOSIS — E559 Vitamin D deficiency, unspecified: Secondary | ICD-10-CM | POA: Diagnosis not present

## 2016-06-06 DIAGNOSIS — M7918 Myalgia, other site: Secondary | ICD-10-CM

## 2016-06-06 DIAGNOSIS — M791 Myalgia: Secondary | ICD-10-CM

## 2016-06-06 DIAGNOSIS — Z23 Encounter for immunization: Secondary | ICD-10-CM

## 2016-06-06 DIAGNOSIS — E538 Deficiency of other specified B group vitamins: Secondary | ICD-10-CM

## 2016-06-06 DIAGNOSIS — Z1231 Encounter for screening mammogram for malignant neoplasm of breast: Secondary | ICD-10-CM

## 2016-06-06 DIAGNOSIS — Z1211 Encounter for screening for malignant neoplasm of colon: Secondary | ICD-10-CM

## 2016-06-06 DIAGNOSIS — M79671 Pain in right foot: Secondary | ICD-10-CM

## 2016-06-06 DIAGNOSIS — Z720 Tobacco use: Secondary | ICD-10-CM

## 2016-06-06 DIAGNOSIS — Z0001 Encounter for general adult medical examination with abnormal findings: Secondary | ICD-10-CM

## 2016-06-06 DIAGNOSIS — M79672 Pain in left foot: Secondary | ICD-10-CM

## 2016-06-06 DIAGNOSIS — Z0000 Encounter for general adult medical examination without abnormal findings: Secondary | ICD-10-CM

## 2016-06-06 DIAGNOSIS — R030 Elevated blood-pressure reading, without diagnosis of hypertension: Secondary | ICD-10-CM | POA: Diagnosis not present

## 2016-06-06 DIAGNOSIS — Z1239 Encounter for other screening for malignant neoplasm of breast: Secondary | ICD-10-CM

## 2016-06-06 LAB — COMPREHENSIVE METABOLIC PANEL
ALT: 14 U/L (ref 0–35)
AST: 18 U/L (ref 0–37)
Albumin: 4.6 g/dL (ref 3.5–5.2)
Alkaline Phosphatase: 65 U/L (ref 39–117)
BUN: 8 mg/dL (ref 6–23)
CO2: 27 meq/L (ref 19–32)
CREATININE: 0.79 mg/dL (ref 0.40–1.20)
Calcium: 9.8 mg/dL (ref 8.4–10.5)
Chloride: 106 mEq/L (ref 96–112)
GFR: 80.73 mL/min (ref >60.00)
GLUCOSE: 93 mg/dL (ref 70–99)
Potassium: 3.5 mEq/L (ref 3.5–5.1)
Sodium: 140 mEq/L (ref 135–145)
Total Bilirubin: 0.8 mg/dL (ref 0.2–1.2)
Total Protein: 7.2 g/dL (ref 6.0–8.3)

## 2016-06-06 LAB — HEMOGLOBIN A1C: HEMOGLOBIN A1C: 5.7 % (ref 4.6–6.5)

## 2016-06-06 LAB — LIPID PANEL
CHOL/HDL RATIO: 4
Cholesterol: 200 mg/dL (ref 0–200)
HDL: 52.8 mg/dL (ref >39.00)
LDL Cholesterol: 132 mg/dL — ABNORMAL HIGH (ref 0–99)
NONHDL: 147.5
Triglycerides: 77 mg/dL (ref 0.0–149.0)
VLDL: 15.4 mg/dL (ref 0.0–40.0)

## 2016-06-06 LAB — CBC
HEMATOCRIT: 43 % (ref 36.0–46.0)
HEMOGLOBIN: 14.3 g/dL (ref 12.0–15.0)
MCHC: 33.3 g/dL (ref 30.0–36.0)
MCV: 90 fl (ref 78.0–100.0)
Platelets: 259 10*3/uL (ref 150.0–400.0)
RBC: 4.77 Mil/uL (ref 3.87–5.11)
RDW: 14.3 % (ref 11.5–15.5)
WBC: 7.9 10*3/uL (ref 4.0–10.5)

## 2016-06-06 LAB — VITAMIN D 25 HYDROXY (VIT D DEFICIENCY, FRACTURES): VITD: 19.14 ng/mL — AB (ref 30.00–100.00)

## 2016-06-06 LAB — VITAMIN B12: Vitamin B-12: 134 pg/mL — ABNORMAL LOW (ref 211–911)

## 2016-06-06 LAB — TSH: TSH: 1.71 u[IU]/mL (ref 0.35–4.50)

## 2016-06-06 MED ORDER — CYCLOBENZAPRINE HCL 10 MG PO TABS
5.0000 mg | ORAL_TABLET | Freq: Every evening | ORAL | 1 refills | Status: DC | PRN
Start: 2016-06-06 — End: 2016-11-12

## 2016-06-06 NOTE — Patient Instructions (Addendum)
Gabapentin for myofascial pain/foot pain  Lisinopril for blood pressure  Check blood pressures and keep a log  Please call the [facility] [place] and schedule your mammogram  Keeping you healthy  Get these tests  Blood pressure- Have your blood pressure checked once a year by your healthcare provider.  Normal blood pressure is 120/80  Weight- Have your body mass index (BMI) calculated to screen for obesity.  BMI is a measure of body fat based on height and weight. You can also calculate your own BMI at ViewBanking.si.  Cholesterol- Have your cholesterol checked every year.  Diabetes- Have your blood sugar checked regularly if you have high blood pressure, high cholesterol, have a family history of diabetes or if you are overweight.  Screening for Colon Cancer- Colonoscopy starting at age 67.  Screening may begin sooner depending on your family history and other health conditions. Follow up colonoscopy as directed by your Gastroenterologist.  Screening for Prostate Cancer- Both blood work (PSA) and a rectal exam help screen for Prostate Cancer.  Screening begins at age 32 with African-American men and at age 42 with Caucasian men.  Screening may begin sooner depending on your family history.  Take these medicines  Aspirin- One aspirin daily can help prevent Heart disease and Stroke.  Flu shot- Every fall.  Tetanus- Every 10 years.  Zostavax- Once after the age of 33 to prevent Shingles.  Pneumonia shot- Once after the age of 35; if you are younger than 68, ask your healthcare provider if you need a Pneumonia shot.  Take these steps  Don't smoke- If you do smoke, talk to your doctor about quitting.  For tips on how to quit, go to www.smokefree.gov or call 1-800-QUIT-NOW.  Be physically active- Exercise 5 days a week for at least 30 minutes.  If you are not already physically active start slow and gradually work up to 30 minutes of moderate physical  activity.  Examples of moderate activity include walking briskly, mowing the yard, dancing, swimming, bicycling, etc.  Eat a healthy diet- Eat a variety of healthy food such as fruits, vegetables, low fat milk, low fat cheese, yogurt, lean meant, poultry, fish, beans, tofu, etc. For more information go to www.thenutritionsource.org  Drink alcohol in moderation- Limit alcohol intake to less than two drinks a day. Never drink and drive.  Dentist- Brush and floss twice daily; visit your dentist twice a year.  Depression- Your emotional health is as important as your physical health. If you're feeling down, or losing interest in things you would normally enjoy please talk to your healthcare provider.  Eye exam- Visit your eye doctor every year.  Safe sex- If you may be exposed to a sexually transmitted infection, use a condom.  Seat belts- Seat belts can save your life; always wear one.  Smoke/Carbon Monoxide detectors- These detectors need to be installed on the appropriate level of your home.  Replace batteries at least once a year.  Skin cancer- When out in the sun, cover up and use sunscreen 15 SPF or higher.  Violence- If anyone is threatening you, please tell your healthcare provider.  Living Will/ Health care power of attorney- Speak with your healthcare provider and family.

## 2016-06-06 NOTE — Progress Notes (Signed)
Subjective:    Patient ID: [name], female    DOB: April 13, 1960, 56 y.o.   MRN: [id number]  HPI This is a 56 yo female who presents today to establish care and for CPE.  Mildly elevated blood pressure at dentist recently. Feels hot. Occasional headaches. Pressure behind eyes at night. 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 Enjoys her work. Divorced x 3, not currently in a relationship. Enjoys exercise.   Had gym injury about 8 years ago and has swelling, pain, tingling of feet. Difficulty sleeping. A lot of foot cramping. Has myofascial pain syndrome in shoulders. Requests refill of flexeril.  Has chronic stomach issues/IBS and has seen GI in the past.   Last CPE- 8 years ago Mammo- not for long time Pap- hysterectomy 20 years ago Colonoscopy- overdue, would like to consider Cologuard testing Tdap- overdue, will have today Flu- annual Eye- annual Dental- semi annual- blood pressure up a little Exercise- none Smokes 1 ppd   Past Medical History:  Diagnosis Date  . Abdominal pain, chronic, right upper quadrant   . Anemia   . Blood transfusion without reported diagnosis   . Chronic constipation   . Cystocele   . IBS (irritable bowel syndrome)    mixed  . Insomnia   . Migraine headache   . Myofascial pain syndrome, cervical   . Osteoporosis   . Rectocele    Past Surgical History:  Procedure Laterality Date  . adhesiolysis    . APPENDECTOMY    . CHOLECYSTECTOMY  2002  . COLONOSCOPY    . ESOPHAGOGASTRODUODENOSCOPY    . TONSILLECTOMY    . TOTAL ABDOMINAL HYSTERECTOMY W/ BILATERAL SALPINGOOPHORECTOMY     Family History  Problem Relation Age of Onset  . Adopted: Yes    Review of Systems  Constitutional: Negative for activity change, appetite change, chills, fatigue and fever.  HENT: Positive for trouble swallowing. Negative for congestion, ear pain, nosebleeds, rhinorrhea, sinus pain, sinus pressure, sneezing and sore throat.   Eyes: Negative for pain and  redness.  Respiratory: Negative for chest tightness, shortness of breath and wheezing.   Cardiovascular: Negative for chest pain, palpitations and leg swelling.  Gastrointestinal: Positive for constipation, diarrhea, rectal pain and vomiting. Negative for abdominal pain.  Endocrine: Positive for heat intolerance.  Genitourinary: Negative for difficulty urinating, dysuria, frequency, pelvic pain, vaginal bleeding and vaginal discharge.  Musculoskeletal: Positive for back pain, joint swelling and neck pain.  Neurological: Positive for weakness and headaches. Negative for dizziness and numbness.  Psychiatric/Behavioral: Negative for confusion, hallucinations and suicidal ideas. The patient is not nervous/anxious.        Objective:   Physical Exam  Constitutional: She is oriented to person, place, and time. She appears well-developed and well-nourished. No distress.  HENT:  Head: Normocephalic and atraumatic.  Right Ear: External ear normal.  Left Ear: External ear normal.  Nose: Nose normal.  Mouth/Throat: Oropharynx is clear and moist. No oropharyngeal exudate.  Eyes: Conjunctivae are normal. Pupils are equal, round, and reactive to light. Right eye exhibits no discharge. Left eye exhibits no discharge.  Neck: Normal range of motion. Neck supple.  Cardiovascular: Normal rate, regular rhythm and normal heart sounds.   Pulses:      Dorsalis pedis pulses are 2+ on the right side, and 2+ on the left side.       Posterior tibial pulses are 2+ on the right side, and 2+ on the left side.  Pulmonary/Chest: Effort normal and breath sounds  normal. Right breast exhibits no inverted nipple, no mass, no nipple discharge, no skin change and no tenderness. Left breast exhibits no inverted nipple, no mass, no nipple discharge, no skin change and no tenderness. Breasts are symmetrical.  Abdominal: Soft. Bowel sounds are normal. She exhibits no distension. There is no tenderness. There is no rebound and no  guarding.  Musculoskeletal: She exhibits no edema or deformity.  Bilateral feet with full ROM, no edema. Mild, generalized erythema with dependence.   Lymphadenopathy:    She has no cervical adenopathy.  Neurological: She is alert and oriented to person, place, and time.  Bilateral feet with normal sensation with monofilament testing.   Skin: Skin is warm and dry. She is not diaphoretic.  Psychiatric: She has a normal mood and affect. Her behavior is normal. Judgment and thought content normal.  Vitals reviewed.    Temp 98 F (36.7 C) (Oral)   Wt 145 lb (65.8 kg)   SpO2 96%   BMI 24.89 kg/m  BP 150/96 BP Readings from Last 3 Encounters:  03/30/15 120/90  10/23/11 120/74  12/10/09 120/70   Depression screen PHQ 2/9 06/06/2016  Decreased Interest 0  Down, Depressed, Hopeless 0  PHQ - 2 Score 0       Assessment & Plan:  1. Annual physical exam - Discussed and encouraged healthy lifestyle choices- adequate sleep, regular exercise, stress management and healthy food choices.    2. Myofascial pain - cyclobenzaprine (FLEXERIL) 10 MG tablet; Take 0.5 tablets (5 mg total) by mouth at bedtime as needed for muscle spasms.  Dispense: 30 tablet; Refill: 1 - tends to interfere with sleep, discussed low dose gabapentin. Patient would like to do her own research.   3. Bilateral foot pain - CBC - Comprehensive metabolic panel - Lipid panel - Vitamin B12 - VITAMIN D 25 Hydroxy (Vit-D Deficiency, Fractures) - Hemoglobin A1c  4. Elevated blood pressure reading - she will check blood pressure at home and send me a log, discussed starting lisinopril and patient wishes to research this and let me know - CBC - Comprehensive metabolic panel - Lipid panel - Vitamin B12 - VITAMIN D 25 Hydroxy (Vit-D Deficiency, Fractures) - Hemoglobin A1c - TSH  5. Need for diphtheria-tetanus-pertussis (Tdap) vaccine - Tdap vaccine greater than or equal to 7yo IM  6. Breast cancer screening -  provided her with number to [place] and she will make her own appointment  7. Colon cancer screening - will touch base with her gastroenterologist as to whether or not she is an appropriate candidate for Cologuard  8. Tobacco Abuse - discussed barriers to smoking cessation and encouraged her to pick stop date. Patient not sure if she is ready to quit at this time.   - Follow up based on labs/blood pressure readings Clarene Reamer, FNP-BC  [facility] [place], [place]  06/07/2016 6:33 AM

## 2016-06-07 ENCOUNTER — Other Ambulatory Visit: Payer: Self-pay | Admitting: Internal Medicine

## 2016-06-09 ENCOUNTER — Encounter: Payer: Self-pay | Admitting: Family Medicine

## 2016-06-09 DIAGNOSIS — M7918 Myalgia, other site: Secondary | ICD-10-CM | POA: Insufficient documentation

## 2016-06-09 DIAGNOSIS — Z720 Tobacco use: Secondary | ICD-10-CM | POA: Insufficient documentation

## 2016-06-09 DIAGNOSIS — E559 Vitamin D deficiency, unspecified: Secondary | ICD-10-CM | POA: Insufficient documentation

## 2016-06-09 DIAGNOSIS — E538 Deficiency of other specified B group vitamins: Secondary | ICD-10-CM | POA: Insufficient documentation

## 2016-06-09 MED ORDER — CHOLECALCIFEROL 1.25 MG (50000 UT) PO CAPS: ORAL_CAPSULE | ORAL | 3 refills | Status: DC

## 2016-06-09 NOTE — Addendum Note (Signed)
Addended by: Clarene Reamer B on: 06/09/2016 01:05 PM   Modules accepted: Orders

## 2016-06-10 ENCOUNTER — Ambulatory Visit: Payer: 59

## 2016-06-10 ENCOUNTER — Encounter: Payer: Self-pay | Admitting: Internal Medicine

## 2016-06-11 ENCOUNTER — Other Ambulatory Visit: Payer: Self-pay | Admitting: Family Medicine

## 2016-06-11 ENCOUNTER — Ambulatory Visit (INDEPENDENT_AMBULATORY_CARE_PROVIDER_SITE_OTHER): Payer: 59 | Admitting: Emergency Medicine

## 2016-06-11 ENCOUNTER — Encounter: Payer: Self-pay | Admitting: Family Medicine

## 2016-06-11 VITALS — BP 140/98

## 2016-06-11 DIAGNOSIS — E538 Deficiency of other specified B group vitamins: Secondary | ICD-10-CM

## 2016-06-11 DIAGNOSIS — G4700 Insomnia, unspecified: Secondary | ICD-10-CM

## 2016-06-11 MED ORDER — CYANOCOBALAMIN 1000 MCG/ML IJ SOLN
1000.0000 ug | INTRAMUSCULAR | Status: AC
  Administered 2016-06-11 – 2016-07-04 (×4): 1000 ug via INTRAMUSCULAR

## 2016-06-11 MED ORDER — RAMELTEON 8 MG PO TABS: 8.0000 mg | ORAL_TABLET | Freq: Every day | ORAL | 0 refills | Status: DC

## 2016-06-17 ENCOUNTER — Telehealth: Payer: Self-pay | Admitting: Family Medicine

## 2016-06-17 NOTE — Telephone Encounter (Signed)
ROI fax to [employer]

## 2016-06-18 ENCOUNTER — Ambulatory Visit (INDEPENDENT_AMBULATORY_CARE_PROVIDER_SITE_OTHER): Payer: 59

## 2016-06-18 DIAGNOSIS — E538 Deficiency of other specified B group vitamins: Secondary | ICD-10-CM

## 2016-06-20 NOTE — Progress Notes (Signed)
Patient with documented vitamin B12 deficiency presented to receive Vitamin B12 injection.

## 2016-06-20 NOTE — Progress Notes (Signed)
Patient with documented vitamin B12 deficiency presented to receive weekly B12 injection.

## 2016-06-25 ENCOUNTER — Telehealth: Payer: Self-pay | Admitting: Family Medicine

## 2016-06-25 ENCOUNTER — Ambulatory Visit (INDEPENDENT_AMBULATORY_CARE_PROVIDER_SITE_OTHER): Payer: 59 | Admitting: *Deleted

## 2016-06-25 DIAGNOSIS — E538 Deficiency of other specified B group vitamins: Secondary | ICD-10-CM

## 2016-06-25 NOTE — Telephone Encounter (Signed)
Rec'd from [place] forward 6 pages to [place]

## 2016-06-25 NOTE — Progress Notes (Signed)
Patient presented today for vitamin B12 injection for documented vitamin B12 deficiency.

## 2016-07-01 ENCOUNTER — Ambulatory Visit: Payer: 59 | Admitting: Sports Medicine

## 2016-07-01 ENCOUNTER — Ambulatory Visit: Payer: 59

## 2016-07-04 ENCOUNTER — Ambulatory Visit (INDEPENDENT_AMBULATORY_CARE_PROVIDER_SITE_OTHER): Payer: 59 | Admitting: Emergency Medicine

## 2016-07-04 DIAGNOSIS — E538 Deficiency of other specified B group vitamins: Secondary | ICD-10-CM

## 2016-07-07 ENCOUNTER — Encounter: Payer: Self-pay | Admitting: Family Medicine

## 2016-07-11 NOTE — Progress Notes (Signed)
Patient received B12 injection for documented B12 deficiency.

## 2016-08-04 ENCOUNTER — Ambulatory Visit (INDEPENDENT_AMBULATORY_CARE_PROVIDER_SITE_OTHER): Payer: 59 | Admitting: Sports Medicine

## 2016-08-04 ENCOUNTER — Ambulatory Visit (INDEPENDENT_AMBULATORY_CARE_PROVIDER_SITE_OTHER): Payer: 59 | Admitting: *Deleted

## 2016-08-04 ENCOUNTER — Encounter: Payer: Self-pay | Admitting: Sports Medicine

## 2016-08-04 VITALS — BP 122/90 | HR 86 | Ht 64.0 in | Wt 149.0 lb

## 2016-08-04 DIAGNOSIS — M4302 Spondylolysis, cervical region: Secondary | ICD-10-CM | POA: Diagnosis not present

## 2016-08-04 DIAGNOSIS — M791 Myalgia: Secondary | ICD-10-CM

## 2016-08-04 DIAGNOSIS — Z720 Tobacco use: Secondary | ICD-10-CM | POA: Diagnosis not present

## 2016-08-04 DIAGNOSIS — E538 Deficiency of other specified B group vitamins: Secondary | ICD-10-CM | POA: Diagnosis not present

## 2016-08-04 DIAGNOSIS — M7918 Myalgia, other site: Secondary | ICD-10-CM

## 2016-08-04 MED ORDER — AMITRIPTYLINE HCL 25 MG PO TABS: 25.0000 mg | ORAL_TABLET | Freq: Every day | ORAL | 1 refills | Status: DC

## 2016-08-04 MED ORDER — CYANOCOBALAMIN 1000 MCG/ML IJ SOLN
1000.0000 ug | Freq: Once | INTRAMUSCULAR | Status: AC
  Administered 2016-08-04: 1000 ug via INTRAMUSCULAR

## 2016-08-04 NOTE — Patient Instructions (Addendum)
Also check out the YouTube Video from Dr. Minerva Ends.   "Powerful Posture and Pain Relief: 12 minutes of Foundation Training" - https://youtu.be/[email or web address]  Try doing this 4-6 times per week  Look into starting Tumeric on a daily basis.  Please think about quitting smoking.  This is very important for your health.  There are many things we can do to help you quit.  Let  us know if you are interested.  You can also call 1-800-QUIT-NOW 973-753-4324) for free smoking cessation counseling.   You had an injection today.  Things to be aware of after injection are listed below: . You may experience no significant improvement or even a slight worsening in your symptoms during the first 24 to 48 hours.  After that we expect your symptoms to improve gradually over the next 2 weeks for the medicine to have its maximal effect.  You should continue to have improvement out to 6 weeks after your injection. . Dr. Paulla Fore recommends icing the site of the injection for 20 minutes  1-2 times the day of your injection . You may shower but no swimming, tub bath or Jacuzzi for 24 hours. . If your bandage falls off this does not need to be replaced.  It is appropriate to remove the bandage after 4 hours. . You may resume light activities as tolerated unless otherwise directed per Dr. Paulla Fore during your visit  POSSIBLE STEROID SIDE EFFECTS:  Side effects from injectable steroids tend to be less than when taken orally however you may experience some of the symptoms listed below.  If experienced these should only last for a short period of time. Change in menstrual flow  Edema (swelling)  Increased appetite Skin flushing (redness)  Skin rash/acne  Thrush (oral) Yeast vaginitis    Increased sweating  Depression Increased blood glucose levels Cramping and leg/calf  Euphoria (feeling happy)  POSSIBLE PROCEDURE SIDE EFFECTS: The side effects of the injection are usually fairly minimal however if you may experience some  of the following side effects that are usually self-limited and will is off on their own.  If you are concerned please feel free to call the office with questions:  Increased numbness or tingling  Nausea or vomiting  Swelling or bruising at the injection site   Please call our office if if you experience any of the following symptoms over the next week as these can be signs of infection:   Fever greater than 100.52F  Significant swelling at the injection site  Significant redness or drainage from the injection site  If after 2 weeks you are continuing to have worsening symptoms please call our office to discuss what the next appropriate actions should be including the potential for a return office visit or other diagnostic testing.

## 2016-08-04 NOTE — Progress Notes (Signed)
OFFICE VISIT NOTE [name]. Gaige Fussner, [place] at [facility] [phone]  HARLEI LEHRMANN - 56 y.o. female MRN [id number]  Date of birth: 1960-09-15  Visit Date: 08/04/2016  PCP: Elby Beck, FNP   Referred by: Elby Beck, FNP  Jari Sportsman, cma acting as scribe for Dr. Paulla Fore.  SUBJECTIVE:   Chief Complaint  Patient presents with  . New Patient (Initial Visit)  . Myofascial Pain Syndrome Bilateral Shoulders   HPI: As below and per problem based documentation when appropriate.   Deriana reports chronic myofascial pain syndrome in both shoulders. Initally started x5 years ago. Has followed [place] [facility] previously. Pt has tried PT and  Massage therapy  with no relief. Had cortisone injections x2 years ago with some relief. Pt is Unable to do perform daily activities such has sweeping or any upper body movement without having pain. Pt does take flexeril with some relief. Pain is localized without radiation.    Review of Systems  Constitutional: Negative for chills, diaphoresis, fever, malaise/fatigue and weight loss.  HENT: Negative.   Eyes: Negative.   Respiratory: Negative.   Cardiovascular: Negative for chest pain and palpitations.  Gastrointestinal: Negative.   Genitourinary: Negative.   Musculoskeletal: Positive for back pain, joint pain, myalgias and neck pain. Negative for falls.  Skin: Negative for itching and rash.  Neurological: Negative.  Negative for weakness.  Endo/Heme/Allergies: Negative.   Psychiatric/Behavioral: Negative.     Otherwise per HPI.  HISTORY & PERTINENT PRIOR DATA:  No specialty comments available. She reports that she has been smoking.  She has never used smokeless tobacco.   Recent Labs  06/06/16 1442  HGBA1C 5.7   Medications & Allergies reviewed per EMR Patient Active Problem List   Diagnosis Date Noted  . 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 Myofascial pain 06/09/2016    . Tobacco abuse 06/09/2016  . Vitamin D deficiency 06/09/2016  . Vitamin B12 deficiency (non anemic) 06/09/2016  . NAUSEA AND VOMITING 12/10/2009  . OTHER DYSPHAGIA 08/20/2009  . ABDOMINAL PAIN, UPPER 08/20/2009  . NAUSEA 07/31/2009  . LUQ PAIN 01/19/2009  . CONSTIPATION 03/23/2008  . IRRITABLE BOWEL SYNDROME 03/23/2008  . RUQ PAIN 03/23/2008   Past Medical History:  Diagnosis Date  . Abdominal pain, chronic, right upper quadrant   . Anemia   . Blood transfusion without reported diagnosis   . Chronic constipation   . Cystocele   . IBS (irritable bowel syndrome)    mixed  . Insomnia   . Migraine headache   . Myofascial pain syndrome, cervical   . Osteoporosis   . Rectocele    Family History  Problem Relation Age of Onset  . Adopted: Yes   Past Surgical History:  Procedure Laterality Date  . adhesiolysis    . APPENDECTOMY    . CHOLECYSTECTOMY  2002  . COLONOSCOPY    . ESOPHAGOGASTRODUODENOSCOPY    . TONSILLECTOMY    . TOTAL ABDOMINAL HYSTERECTOMY W/ BILATERAL SALPINGOOPHORECTOMY     Social History   Occupational History  . Not on file.   Social History Main Topics  . Smoking status: Current Every Day Smoker  . Smokeless tobacco: Never Used  . Alcohol use No  . Drug use: No  . Sexual activity: Not on file    OBJECTIVE:  VS:  HT:5\' 4"  (162.6 cm)   WT:149 lb (67.6 kg)  BMI:25.6    BP:122/90  HR:86bpm  TEMP: ( )  RESP:98 % EXAM:  Findings:  WDWN, NAD, Non-toxic appearing Alert & appropriately interactive Not depressed or anxious appearing No increased work of breathing. Pupils are equal. EOM intact without nystagmus No clubbing or cyanosis of the extremities appreciated No significant rashes/lesions/ulcerations overlying the examined area. Radial pulses 2+/4.  No significant generalized UE edema. Sensation intact to light touch in upper extremities.  Neck & Shoulders: Well aligned, no significant torticollis No significant midline tenderness.    TTP over the left levator scapula.  This does cause slight radiation into the neck and upper back.  Consistent with a trigger point. Cervical ROM:       Flexion: 70      Extension: 60      Right   - Rotation: 70 Sidebending: 30       Left     - Rotation: 70 Sidebending: 30  NEURAL TENSION SIGNS Right       Brachial Plexus Squeeze: Non-tender       Arm Squeeze Test: Non-tender      Spurling's Compression Test:  Ipsilateral -Negative/ No radiation  Left       Brachial Plexus Squeeze: Non-tender       Arm Squeeze Test: Non-tender       Spurling's Compression Test:  Ipsilateral -Negative/ No radiation  Lhermitte's Compression test:  Negative/ No radiation   REFLEXES                           Right                         Left DTR - C5 -Biceps               2+/4                       2+/4 DTR - C6 - Brachiorad 2+/4                       2+/4 DTR - C7 - Triceps              2+/4                       2+/4 UMN - Hoffman's slight hyperexcitability but no true positive.  MOTOR TESTING: Intact and symmetric bilaterally in all UE myotomes      No results found. ASSESSMENT & PLAN:   Problem List Items Addressed This Visit    Myofascial pain - Primary    Underlying cervical spondylosis with myofascial pain syndrome.  She is responded well to trigger point injections in the past and this was repeated for today.  We also plan to refer to physical therapy for consideration of dry needling she is interested in trying this.  Did discuss how smoking affects her pain levels as well as discussed the use informed quadrant therapy for neuromuscular pain she is interested in starting amitriptyline at night.  We will plan to follow-up with her in 8 weeks to ensure that she has had good improvement.  Therapeutic exercises provided per foundations training.   PROCEDURE NOTE: Left Trapezium Trigger Point injection   DESCRIPTION OF PROCEDURE:  The patient's clinical condition is marked by  substantial pain and/or significant functional disability. Other conservative therapy has not provided relief, is contraindicated, or not appropriate. There is a reasonable likelihood that injection will significantly improve the patient's pain and/or functional  impairment. After discussing the risks, benefits and expected outcomes of the injection and all questions were reviewed and answered, the patient wished to undergo the above named procedure. Verbal consent was obtained. The target structure was injected under direct visualization using sterile technique as below: PREP: Alcohol, Ethel Chloride APPROACH: 25g 1.5" needle INJECTATE: 1cc 1% lidocaine, 1cc 40mg  DepoMedrol DRESSING: Band-Aid  Post procedural instructions including recommending icing and warning signs for infection were reviewed. This procedure was well tolerated and there were no complications.          Relevant Orders   Ambulatory referral to Physical Therapy   Tobacco abuse   Cervical spondylolysis   Relevant Orders   Ambulatory referral to Physical Therapy      Follow-up: Return in about 8 weeks (around 09/29/2016).   CMA/ATC served as Education administrator during this visit. History, Physical, and Plan performed by medical provider. Documentation and orders reviewed and attested to.      Teresa Coombs, [facility] Physician

## 2016-08-04 NOTE — Assessment & Plan Note (Addendum)
Underlying cervical spondylosis with myofascial pain syndrome.  She is responded well to trigger point injections in the past and this was repeated for today.  We also plan to refer to physical therapy for consideration of dry needling she is interested in trying this.  Did discuss how smoking affects her pain levels as well as discussed the use informed quadrant therapy for neuromuscular pain she is interested in starting amitriptyline at night.  We will plan to follow-up with her in 8 weeks to ensure that she has had good improvement.  Therapeutic exercises provided per foundations training.   PROCEDURE NOTE: Left Trapezium Trigger Point injection   DESCRIPTION OF PROCEDURE:  The patient's clinical condition is marked by substantial pain and/or significant functional disability. Other conservative therapy has not provided relief, is contraindicated, or not appropriate. There is a reasonable likelihood that injection will significantly improve the patient's pain and/or functional impairment. After discussing the risks, benefits and expected outcomes of the injection and all questions were reviewed and answered, the patient wished to undergo the above named procedure. Verbal consent was obtained. The target structure was injected under direct visualization using sterile technique as below: PREP: Alcohol, Ethel Chloride APPROACH: 25g 1.5" needle INJECTATE: 1cc 1% lidocaine, 1cc 40mg  DepoMedrol DRESSING: Band-Aid  Post procedural instructions including recommending icing and warning signs for infection were reviewed. This procedure was well tolerated and there were no complications.

## 2016-08-11 ENCOUNTER — Encounter: Payer: Self-pay | Admitting: Sports Medicine

## 2016-08-22 ENCOUNTER — Ambulatory Visit: Payer: 59

## 2016-09-15 ENCOUNTER — Ambulatory Visit: Payer: 59 | Admitting: Sports Medicine

## 2016-09-15 ENCOUNTER — Ambulatory Visit: Payer: 59

## 2016-09-25 ENCOUNTER — Encounter: Payer: Self-pay | Admitting: Family Medicine

## 2016-09-29 ENCOUNTER — Ambulatory Visit: Payer: 59 | Admitting: Sports Medicine

## 2016-10-01 ENCOUNTER — Telehealth: Payer: Self-pay | Admitting: Family Medicine

## 2016-10-01 NOTE — Telephone Encounter (Signed)
I have called patient 3 times and sent a letter to schedule physical therapy. I have been unable to reach them. I have cancelled referral.

## 2016-10-03 ENCOUNTER — Encounter: Payer: Self-pay | Admitting: Family Medicine

## 2016-10-03 NOTE — Telephone Encounter (Signed)
I sent her a Psychiatric nurse informing her that she can call the office to schedule PT if she desires.

## 2016-11-12 ENCOUNTER — Other Ambulatory Visit: Payer: Self-pay | Admitting: Family Medicine

## 2016-11-12 DIAGNOSIS — M7918 Myalgia, other site: Secondary | ICD-10-CM

## 2017-03-13 ENCOUNTER — Other Ambulatory Visit: Payer: Self-pay | Admitting: Internal Medicine

## 2017-03-13 NOTE — Telephone Encounter (Signed)
May I refill Sir? Thank you. 

## 2017-03-13 NOTE — Telephone Encounter (Signed)
Refill x 2 

## 2017-04-17 ENCOUNTER — Encounter: Payer: Self-pay | Admitting: Sports Medicine

## 2017-04-21 ENCOUNTER — Ambulatory Visit (INDEPENDENT_AMBULATORY_CARE_PROVIDER_SITE_OTHER): Payer: 59

## 2017-04-21 ENCOUNTER — Encounter: Payer: Self-pay | Admitting: Podiatry

## 2017-04-21 ENCOUNTER — Other Ambulatory Visit: Payer: Self-pay | Admitting: Podiatry

## 2017-04-21 ENCOUNTER — Ambulatory Visit (INDEPENDENT_AMBULATORY_CARE_PROVIDER_SITE_OTHER): Payer: 59 | Admitting: Podiatry

## 2017-04-21 DIAGNOSIS — D3613 Benign neoplasm of peripheral nerves and autonomic nervous system of lower limb, including hip: Secondary | ICD-10-CM

## 2017-04-21 DIAGNOSIS — M7751 Other enthesopathy of right foot: Secondary | ICD-10-CM

## 2017-04-21 DIAGNOSIS — M7750 Other enthesopathy of unspecified foot: Secondary | ICD-10-CM

## 2017-04-21 DIAGNOSIS — M7752 Other enthesopathy of left foot: Secondary | ICD-10-CM

## 2017-04-21 DIAGNOSIS — M779 Enthesopathy, unspecified: Secondary | ICD-10-CM

## 2017-04-21 DIAGNOSIS — M778 Other enthesopathies, not elsewhere classified: Secondary | ICD-10-CM

## 2017-04-21 MED ORDER — METHYLPREDNISOLONE 4 MG PO TBPK: ORAL_TABLET | ORAL | 0 refills | Status: DC

## 2017-04-21 NOTE — Progress Notes (Signed)
Subjective:  Patient ID: [name], female    DOB: 1960-09-18,  MRN: [id number] HPI Chief Complaint  Patient presents with  . New Patient (Initial Visit)  . Foot Pain    b/l burning, stinging, cramping, swelling "all over feet the past 4-5 yrs, worse since being on feet more" ; Right 2nd and 3rd interspace, has been diagnosed with neuroma several yrs ago, got injections (helped a little)      57 y.o. female presents with the above complaint.   Denies fever chills nausea vomiting chest pain shortness of breath calf pain and headache.  Past Medical History:  Diagnosis Date  . Abdominal pain, chronic, right upper quadrant   . Anemia   . Blood transfusion without reported diagnosis   . Chronic constipation   . Cystocele   . IBS (irritable bowel syndrome)    mixed  . Insomnia   . Migraine headache   . Myofascial pain syndrome, cervical   . Osteoporosis   . Rectocele    Past Surgical History:  Procedure Laterality Date  . adhesiolysis    . APPENDECTOMY    . CHOLECYSTECTOMY  2002  . COLONOSCOPY    . ESOPHAGOGASTRODUODENOSCOPY    . TONSILLECTOMY    . TOTAL ABDOMINAL HYSTERECTOMY W/ BILATERAL SALPINGOOPHORECTOMY      Current Outpatient Medications:  .  cyclobenzaprine (FLEXERIL) 10 MG tablet, TAKE 1/2 TABLET BY MOUTH AT BEDTIME AS NEEDED FOR MUSCLE SPASMS, Disp: 30 tablet, Rfl: 0 .  hyoscyamine (LEVSIN SL) 0.125 MG SL tablet, TAKE 1 TABLET (0.125 MG TOTAL) BY MOUTH EVERY 4 (FOUR) HOURS AS NEEDED FOR CRAMPING., Disp: 60 tablet, Rfl: 1 .  methylPREDNISolone (MEDROL DOSEPAK) 4 MG TBPK tablet, 6 day dose pack - take as directed, Disp: 21 tablet, Rfl: 0  Allergies  Allergen Reactions  . Meloxicam Hives  . Penicillins    Review of Systems Objective:  There were no vitals filed for this visit.  General: Well developed, nourished, in no acute distress, alert and oriented x3   Dermatological: Skin is warm, dry and supple bilateral. Nails x 10 are well maintained; remaining  integument appears unremarkable at this time. There are no open sores, no preulcerative lesions, no rash or signs of infection present.  Vascular: Dorsalis Pedis artery and Posterior Tibial artery pedal pulses are 2/4 bilateral with immedate capillary fill time. Pedal hair growth present. No varicosities and no lower extremity edema present bilateral.   Neruologic: Grossly intact via light touch bilateral. Vibratory intact via tuning fork bilateral. Protective threshold with Semmes Wienstein monofilament intact to all pedal sites bilateral. Patellar and Achilles deep tendon reflexes 2+ bilateral. No Babinski or clonus noted bilateral.   Musculoskeletal: No gross boney pedal deformities bilateral. No pain, crepitus, or limitation noted with foot and ankle range of motion bilateral. Muscular strength 5/5 in all groups tested bilateral.  Pain on palpation and range of motion of the second and third metatarsophalangeal joints bilaterally.  Gait: Unassisted, Nonantalgic.    Radiographs:  No acute findings noted today elongated second and third metatarsals no fractures identified.  Assessment & Plan:   Assessment: Capsulitis second and third metatarsophalangeal joints bilaterally.  Plan: After sterile Betadine skin prep I injected around the joint today with 20 mg of Kenalog 5 mg Marcaine point maximal shoes.  Discussed appropriate shoe gear modifications. 4.  I also injected the bilateral second metatarsal phalangeal joint.  After sterile skin prep 20 mg Kenalog 5 mg Marcaine.    Max T. [place], [place]

## 2017-05-19 ENCOUNTER — Encounter: Payer: Self-pay | Admitting: Podiatry

## 2017-05-19 ENCOUNTER — Ambulatory Visit (INDEPENDENT_AMBULATORY_CARE_PROVIDER_SITE_OTHER): Payer: 59 | Admitting: Podiatry

## 2017-05-19 DIAGNOSIS — G5761 Lesion of plantar nerve, right lower limb: Secondary | ICD-10-CM

## 2017-05-19 DIAGNOSIS — G5781 Other specified mononeuropathies of right lower limb: Secondary | ICD-10-CM

## 2017-05-20 NOTE — Progress Notes (Signed)
She presents today for follow-up of her capsulitis second metatarsophalangeal joint bilaterally states that she is approximately 70% improved.  However she still has pain along the third interdigital space of the right foot.  Objective: Vital signs are stable she is alert and oriented x3 she no longer has pain on palpation or range of motion of the second metatarsal phalangeal joints they are not nearly as swollen or is hard and painful as they were in the past.  Her pulses remain palpable neurologic sensorium is intact though she does have a palpable Mulder's click with pain on palpation to the third interdigital space of the right foot.  Assessment: Neuroma third interdigital space right foot.  Plan: After sterile Betadine skin prep I injected the third interdigital space today with 2 cc of 4% dehydrated alcohol.  She tolerated procedure well and I will follow-up with her in 3 weeks we discussed the series in great detail today she understands that and is amenable to it.  She does not want surgical excision.

## 2017-06-11 ENCOUNTER — Ambulatory Visit: Payer: 59 | Admitting: Podiatry

## 2017-06-15 ENCOUNTER — Encounter: Payer: Self-pay | Admitting: Sports Medicine

## 2017-06-15 ENCOUNTER — Ambulatory Visit (INDEPENDENT_AMBULATORY_CARE_PROVIDER_SITE_OTHER): Payer: 59 | Admitting: Sports Medicine

## 2017-06-15 VITALS — BP 124/82 | HR 103 | Ht 64.0 in | Wt 151.4 lb

## 2017-06-15 DIAGNOSIS — M25512 Pain in left shoulder: Secondary | ICD-10-CM | POA: Diagnosis not present

## 2017-06-15 DIAGNOSIS — M4302 Spondylolysis, cervical region: Secondary | ICD-10-CM

## 2017-06-15 DIAGNOSIS — M7918 Myalgia, other site: Secondary | ICD-10-CM | POA: Diagnosis not present

## 2017-06-15 MED ORDER — CYCLOBENZAPRINE HCL 10 MG PO TABS
10.0000 mg | ORAL_TABLET | Freq: Three times a day (TID) | ORAL | 3 refills | Status: AC | PRN
Start: 2017-06-15 — End: ?

## 2017-06-15 NOTE — Patient Instructions (Addendum)

## 2017-06-15 NOTE — Progress Notes (Signed)
[name]. [place], [place] at [facility] [phone]  [name] - 57 y.o. female MRN [id number]  Date of birth: 07/06/1960  Visit Date: 06/15/2017  PCP: Elby Beck, FNP   Referred by: Elby Beck, FNP  Scribe for today's visit: Wendy Poet, LAT, ATC     SUBJECTIVE:  [name] is here for Follow-up (Shoulder / Upper trap pain) .   08/04/2016: [name] reports chronic myofascial pain syndrome in both shoulders. Initally started x5 years ago. Has followed [facility] previously. Pt has tried PT and  Massage therapy  with no relief. Had cortisone injections x2 years ago with some relief. Pt is Unable to do perform daily activities such has sweeping or any upper body movement without having pain. Pt does take flexeril with some relief. Pain is localized without radiation.  06/15/17: Compared to the last office visit on 08/04/16, her previously described shoulder pain symptoms are worsening due to not seeing Dr. Paulla Kelly since July 2018. Current symptoms are mild & are radiating to the neck along the path of the levator scapula. She has been taking Flexeril prn.  She has previously attended PT and done massage therapy.  She had a L [facility] trigger point injection previously.  She had been given the [employer] previously.  She notes that she started taking CBD oil for approximately 6 weeks.  ROS Denies night time disturbances. Denies fevers, chills, or night sweats. Denies unexplained weight loss. Denies personal history of cancer. Reports changes in bowel or bladder habits. Improvement in IBS symptoms. Denies recent unreported falls. Denies new or worsening dyspnea or wheezing. Reports headaches or dizziness. Yes to headaches Denies numbness, tingling or weakness  In the extremities.  Denies dizziness or presyncopal episodes Denies lower extremity edema    HISTORY & PERTINENT PRIOR DATA:  Prior History reviewed  and updated per electronic medical record.  Significant/pertinent history, findings, studies include:  reports that she has been smoking.  She has never used smokeless tobacco. No results for input(s): HGBA1C, LABURIC, CREATINE in the last 8760 hours. No specialty comments available. No problems updated.  OBJECTIVE:  VS:  HT:5\' 4"  (162.6 cm)   WT:151 lb 6.4 oz (68.7 kg)  BMI:25.98    BP:124/82  HR:(Abnormal) 103bpm  TEMP: ( )  RESP:97 %   PHYSICAL EXAM: Constitutional: WDWN, Non-toxic appearing. Psychiatric: Alert & appropriately interactive.  Not depressed or anxious appearing. Respiratory: No increased work of breathing.  Trachea Midline Eyes: Pupils are equal.  EOM intact without nystagmus.  No scleral icterus  Vascular Exam: warm to touch no edema  upper extremity neuro exam: unremarkable normal strength normal sensation normal reflexes  MSK Exam: Full overhead range of motion.  She has palpable trigger points in the left trapezius and right rhomboids.  She has a anterior chain dominance.   ASSESSMENT &.  PLAN:   1. Cervical spondylolysis   2. Myofascial pain   3. Trigger point of left shoulder region     PLAN: Trigger point injections performed today.  She should continue with home therapeutic exercises and medications were refilled today including Flexeril.    Links to [employer] provided today per Patient Instructions.  These exercises were developed by [name], DC with a strong emphasis on core neuromuscular reducation and postural realignment through body-weight exercises.  Follow-up: Return if symptoms worsen or fail to improve.      Please see additional documentation for Objective, Assessment  and Plan sections. Pertinent additional documentation may be included in corresponding procedure notes, imaging studies, problem based documentation and patient instructions. Please see these sections of the encounter for additional information  regarding this visit.  CMA/ATC served as Education administrator during this visit. History, Physical, and Plan performed by medical provider. Documentation and orders reviewed and attested to.      [name], [place] [facility] Physician

## 2017-06-15 NOTE — Progress Notes (Signed)
PROCEDURE NOTE:  [facility] Guided: Injection: Bilateral trigger point injections   DESCRIPTION OF PROCEDURE:  The patient's clinical condition is marked by substantial pain and/or significant functional disability. Other conservative therapy has not provided relief, is contraindicated, or not appropriate. There is a reasonable likelihood that injection will significantly improve the patient's pain and/or functional impairment.  After discussing the risks, benefits and expected outcomes of the injection and all questions were reviewed and answered, the patient wished to undergo the above named procedure. Verbal consent was obtained. The skin was then prepped in sterile fashion and the target structure was injected as below:  4 trigger points total were performed today using a 1 cc volume of equal parts 0 per 5% Marcaine and 40 mg Depo-Medrol (total of 2 cc of 40 mg Depo-Medrol used completely.  Is performed on a 25-gauge needle using sterile technique with alcohol and ethyl chloride prep.  Band-Aids were applied.  2 areas within the right periscapular region/mid thoracic region and to within the left low cervical, upper thoracic/trapezius.  The procedure was tolerated well without difficulty or complications.

## 2017-06-20 ENCOUNTER — Encounter: Payer: Self-pay | Admitting: Sports Medicine

## 2017-07-23 ENCOUNTER — Encounter: Payer: Self-pay | Admitting: Family Medicine

## 2018-07-08 ENCOUNTER — Encounter: Payer: Self-pay | Admitting: Family Medicine

## 2018-07-20 ENCOUNTER — Ambulatory Visit: Payer: 59 | Admitting: Family Medicine

## 2018-07-20 NOTE — Progress Notes (Deleted)
[facility] [address] [place], [place] [address] Phone: [phone] Subjective:    I'm seeing this patient by the request  of:    CC:   [id number]  [name] is a 58 y.o. female coming in with complaint of ***  Onset-  Location Duration-  Character- Aggravating factors- Reliving factors-  Therapies tried-  Severity-     Past Medical History:  Diagnosis Date  . Abdominal pain, chronic, right upper quadrant   . Anemia   . Blood transfusion without reported diagnosis   . Chronic constipation   . Cystocele   . IBS (irritable bowel syndrome)    mixed  . Insomnia   . Migraine headache   . Myofascial pain syndrome, cervical   . Osteoporosis   . Rectocele    Past Surgical History:  Procedure Laterality Date  . adhesiolysis    . APPENDECTOMY    . CHOLECYSTECTOMY  2002  . COLONOSCOPY    . ESOPHAGOGASTRODUODENOSCOPY    . TONSILLECTOMY    . TOTAL ABDOMINAL HYSTERECTOMY W/ BILATERAL SALPINGOOPHORECTOMY     Social History   Socioeconomic History  . Marital status: Divorced    Spouse name: Not on file  . Number of children: Not on file  . Years of education: Not on file  . Highest education level: Not on file  Occupational History  . Not on file  Social Needs  . Financial resource strain: Not on file  . Food insecurity    Worry: Not on file    Inability: Not on file  . Transportation needs    Medical: Not on file    Non-medical: Not on file  Tobacco Use  . Smoking status: Current Every Day Smoker  . Smokeless tobacco: Never Used  Substance and Sexual Activity  . Alcohol use: No  . Drug use: No  . Sexual activity: Not on file  Lifestyle  . Physical activity    Days per week: Not on file    Minutes per session: Not on file  . Stress: Not on file  Relationships  . Social Herbalist on phone: Not on file    Gets together: Not on file    Attends religious service: Not on file    Active member of club or  organization: Not on file    Attends meetings of clubs or organizations: Not on file    Relationship status: Not on file  Other Topics Concern  . Not on file  Social History Narrative  . Not on file   Allergies  Allergen Reactions  . Meloxicam Hives  . Penicillins    Family History  Adopted: Yes         Current Outpatient Medications (Other):  .  cyclobenzaprine (FLEXERIL) 10 MG tablet, Take 1 tablet (10 mg total) by mouth 3 (three) times daily as needed for muscle spasms. .  hyoscyamine (LEVSIN SL) 0.125 MG SL tablet, TAKE 1 TABLET (0.125 MG TOTAL) BY MOUTH EVERY 4 (FOUR) HOURS AS NEEDED FOR CRAMPING. Marland Kitchen  Potassium 99 MG TABS, Take 1 tablet by mouth daily.    Past medical history, social, surgical and family history all reviewed in electronic medical record.  No pertanent information unless stated regarding to the chief complaint.   Review of Systems:  No headache, visual changes, nausea, vomiting, diarrhea, constipation, dizziness, abdominal pain, skin rash, fevers, chills, night sweats, weight loss, swollen lymph nodes, body aches, joint swelling, muscle aches, chest  pain, shortness of breath, mood changes.   Objective  There were no vitals taken for this visit. Systems examined below as of    General: No apparent distress alert and oriented x3 mood and affect normal, dressed appropriately.  HEENT: Pupils equal, extraocular movements intact  Respiratory: Patient's speak in full sentences and does not appear short of breath  Cardiovascular: No lower extremity edema, non tender, no erythema  Skin: Warm dry intact with no signs of infection or rash on extremities or on axial skeleton.  Abdomen: Soft nontender  Neuro: Cranial nerves II through XII are intact, neurovascularly intact in all extremities with 2+ DTRs and 2+ pulses.  Lymph: No lymphadenopathy of posterior or anterior cervical chain or axillae bilaterally.  Gait normal with good balance and coordination.  MSK:   Non tender with full range of motion and good stability and symmetric strength and tone of shoulders, elbows, wrist, hip, knee and ankles bilaterally.     Impression and Recommendations:     This case required medical decision making of moderate complexity. The above documentation has been reviewed and is accurate and complete Lyndal Pulley, DO       Note: This dictation was prepared with Dragon dictation along with smaller phrase technology. Any transcriptional errors that result from this process are unintentional.

## 2018-08-05 ENCOUNTER — Ambulatory Visit: Payer: 59 | Admitting: Family Medicine

## 2022-08-13 ENCOUNTER — Ambulatory Visit (HOSPITAL_COMMUNITY)
Admission: RE | Admit: 2022-08-13 | Discharge: 2022-08-13 | Disposition: A | Discharge status: Home / Self Care | Payer: Commercial Managed Care - PPO | Source: Ambulatory Visit | Attending: Sports Medicine | Admitting: Sports Medicine

## 2022-08-13 ENCOUNTER — Other Ambulatory Visit: Payer: Self-pay | Admitting: Sports Medicine

## 2022-08-13 DIAGNOSIS — M25511 Pain in right shoulder: Secondary | ICD-10-CM | POA: Insufficient documentation

## 2022-08-13 DIAGNOSIS — G43109 Migraine with aura, not intractable, without status migrainosus: Secondary | ICD-10-CM | POA: Insufficient documentation

## 2022-08-13 DIAGNOSIS — M25512 Pain in left shoulder: Secondary | ICD-10-CM | POA: Diagnosis present
